# Patient Record
Sex: Female | Born: 1980 | Race: White | Hispanic: No | Marital: Married | Smoking: Former smoker
Health system: Southern US, Community
[De-identification: ages and names within clinical notes are randomized; demographics above are authoritative.]

## PROBLEM LIST (undated history)

## (undated) ENCOUNTER — Inpatient Hospital Stay (HOSPITAL_COMMUNITY): Payer: Self-pay

## (undated) DIAGNOSIS — I499 Cardiac arrhythmia, unspecified: Secondary | ICD-10-CM

## (undated) DIAGNOSIS — M722 Plantar fascial fibromatosis: Secondary | ICD-10-CM

## (undated) DIAGNOSIS — Z8489 Family history of other specified conditions: Secondary | ICD-10-CM

## (undated) DIAGNOSIS — N970 Female infertility associated with anovulation: Secondary | ICD-10-CM

## (undated) DIAGNOSIS — M6701 Short Achilles tendon (acquired), right ankle: Secondary | ICD-10-CM

## (undated) HISTORY — DX: Cardiac arrhythmia, unspecified: I49.9

## (undated) HISTORY — PX: WISDOM TOOTH EXTRACTION: SHX21

---

## 2007-01-04 HISTORY — PX: ERCP: SHX60

## 2007-01-04 HISTORY — PX: OTHER SURGICAL HISTORY: SHX169

## 2011-03-22 ENCOUNTER — Encounter: Payer: Commercial Managed Care - PPO | Admitting: Obstetrics and Gynecology

## 2012-01-04 NOTE — L&D Delivery Note (Signed)
Delivery Note At 8:48 AM a viable and healthy female was delivered via Vaginal, Spontaneous Delivery (Presentation: Right Occiput Anterior).  APGAR: 8, 9; weight 7#6.9.   Placenta status: delivered, intact.  Cord: 3 vessels with the following complications: nuchal cord.    Anesthesia: Epidural  Episiotomy: None Lacerations: 2nd degree Suture Repair: 3.0 vicryl rapide Est. Blood Loss (mL): 400cc  Mom to postpartum.  Baby to stay with mom and dad.Marland Kitchen  [name],Bowyn Mercier 09/09/2012, 9:19 AM  Br/ O neg/ Mirena PP/ RI

## 2012-01-25 LAB — OB RESULTS CONSOLE RPR: RPR: NONREACTIVE

## 2012-01-25 LAB — OB RESULTS CONSOLE HIV ANTIBODY (ROUTINE TESTING): HIV: NONREACTIVE

## 2012-01-25 LAB — OB RESULTS CONSOLE GC/CHLAMYDIA
Chlamydia: NEGATIVE
Gonorrhea: NEGATIVE

## 2012-01-25 LAB — OB RESULTS CONSOLE RUBELLA ANTIBODY, IGM: Rubella: UNDETERMINED

## 2012-07-30 ENCOUNTER — Inpatient Hospital Stay (HOSPITAL_COMMUNITY)
Admission: AD | Admit: 2012-07-30 | Discharge: 2012-07-30 | Disposition: A | Discharge status: Home / Self Care | Payer: BC Managed Care – PPO | Source: Ambulatory Visit | Attending: Obstetrics and Gynecology | Admitting: Obstetrics and Gynecology

## 2012-07-30 ENCOUNTER — Encounter (HOSPITAL_COMMUNITY): Payer: Self-pay

## 2012-07-30 DIAGNOSIS — O479 False labor, unspecified: Secondary | ICD-10-CM

## 2012-07-30 DIAGNOSIS — O4700 False labor before 37 completed weeks of gestation, unspecified trimester: Secondary | ICD-10-CM | POA: Insufficient documentation

## 2012-07-30 DIAGNOSIS — R109 Unspecified abdominal pain: Secondary | ICD-10-CM | POA: Insufficient documentation

## 2012-07-30 DIAGNOSIS — R42 Dizziness and giddiness: Secondary | ICD-10-CM | POA: Insufficient documentation

## 2012-07-30 LAB — URINALYSIS, ROUTINE W REFLEX MICROSCOPIC
Bilirubin Urine: NEGATIVE
Glucose, UA: NEGATIVE mg/dL
Hgb urine dipstick: NEGATIVE
Specific Gravity, Urine: 1.015 (ref 1.005–1.030)
Urobilinogen, UA: 0.2 mg/dL (ref 0.0–1.0)

## 2012-07-30 LAB — URINE MICROSCOPIC-ADD ON

## 2012-07-30 NOTE — MAU Note (Signed)
Pt c/o woke up from nap shaking and cold, then shortly after started feeling flushed and actively sweating on palms, wrists, forehead and arm pits. Denies any pain.

## 2012-07-30 NOTE — MAU Note (Signed)
Patient is in with feeling of "something isn't right", possible intermittent contraction due to persistent cramping, bp lower than her normal. She denies vaginal bleeding or lof. She reports good fetal movement. fht 145

## 2012-07-30 NOTE — MAU Provider Note (Signed)
  History     CSN: [id number]  Arrival date and time: 07/30/12 2013   First Provider Initiated Contact with Patient 07/30/12 2111      Chief Complaint  Patient presents with  . Abdominal Cramping  . Dizziness   HPI  Laurie [name] is a 32 y.o. G1P0 at Unknown who presents today with vague complaints of feeling "yucky".  Past Medical History  Diagnosis Date  . Pancreatitis     Past Surgical History  Procedure Laterality Date  . Sphincterectomy    . Wisdom tooth extraction      Family History  Problem Relation Age of Onset  . Hypertension Mother   . COPD Maternal Grandmother     History  Substance Use Topics  . Smoking status: Never Smoker   . Smokeless tobacco: Not on file  . Alcohol Use: No    Allergies: No Known Allergies  Prescriptions prior to admission  Medication Sig Dispense Refill  . Prenatal Vit-Fe Fumarate-FA (PRENATAL MULTIVITAMIN) TABS Take 1 tablet by mouth at bedtime.        ROS Physical Exam   Blood pressure 133/91, pulse 99, resp. rate 16, height 5\' 6"  (1.676 m), weight 91.627 kg (202 lb), SpO2 99.00%.  Physical Exam  MAU Course  Procedures  Results for orders placed during the hospital encounter of 07/30/12 (from the past 24 hour(s))  URINALYSIS, ROUTINE W REFLEX MICROSCOPIC     Status: Abnormal   Collection Time    07/30/12  8:35 PM      Result Value Range   Color, Urine YELLOW  YELLOW   APPearance CLEAR  CLEAR   Specific Gravity, Urine 1.015  1.005 - 1.030   pH 6.0  5.0 - 8.0   Glucose, UA NEGATIVE  NEGATIVE mg/dL   Hgb urine dipstick NEGATIVE  NEGATIVE   Bilirubin Urine NEGATIVE  NEGATIVE   Ketones, ur >80 (*) NEGATIVE mg/dL   Protein, ur NEGATIVE  NEGATIVE mg/dL   Urobilinogen, UA 0.2  0.0 - 1.0 mg/dL   Nitrite NEGATIVE  NEGATIVE   Leukocytes, UA TRACE (*) NEGATIVE  URINE MICROSCOPIC-ADD ON     Status: Abnormal   Collection Time    07/30/12  8:35 PM      Result Value Range   Squamous Epithelial / LPF FEW (*) RARE    WBC, UA 3-6  <3 WBC/hpf   Bacteria, UA FEW (*) RARE   Urine-Other MUCOUS PRESENT    GLUCOSE, CAPILLARY     Status: None   Collection Time    07/30/12  9:26 PM      Result Value Range   Glucose-Capillary 98  70 - 99 mg/dL    4098: Spoke with Dr. Ambrose Mantle, ok for dc home Assessment and Plan   1. Braxton Hick's contraction    Likely 2/2 dehydration. Increase PO fluids PTL precautions given FU with the office as planned   Tawnya Crook 07/30/2012, 9:20 PM

## 2012-07-31 LAB — URINE CULTURE: Colony Count: 8000

## 2012-08-29 ENCOUNTER — Telehealth (HOSPITAL_COMMUNITY): Payer: Self-pay | Admitting: *Deleted

## 2012-08-29 ENCOUNTER — Encounter (HOSPITAL_COMMUNITY): Payer: Self-pay | Admitting: *Deleted

## 2012-08-29 NOTE — Telephone Encounter (Signed)
Preadmission screen  

## 2012-09-04 ENCOUNTER — Inpatient Hospital Stay (HOSPITAL_COMMUNITY): Admission: RE | Admit: 2012-09-04 | Payer: BC Managed Care – PPO | Source: Ambulatory Visit

## 2012-09-08 ENCOUNTER — Encounter (HOSPITAL_COMMUNITY): Payer: Self-pay | Admitting: *Deleted

## 2012-09-08 ENCOUNTER — Inpatient Hospital Stay (HOSPITAL_COMMUNITY): Payer: BC Managed Care – PPO | Admitting: Anesthesiology

## 2012-09-08 ENCOUNTER — Encounter (HOSPITAL_COMMUNITY): Payer: Self-pay | Admitting: Anesthesiology

## 2012-09-08 ENCOUNTER — Inpatient Hospital Stay (HOSPITAL_COMMUNITY)
Admission: AD | Admit: 2012-09-08 | Discharge: 2012-09-11 | DRG: 372 | Disposition: A | Discharge status: Home / Self Care | Payer: BC Managed Care – PPO | Source: Ambulatory Visit | Attending: Obstetrics and Gynecology | Admitting: Obstetrics and Gynecology

## 2012-09-08 DIAGNOSIS — Z3400 Encounter for supervision of normal first pregnancy, unspecified trimester: Secondary | ICD-10-CM

## 2012-09-08 DIAGNOSIS — O99892 Other specified diseases and conditions complicating childbirth: Secondary | ICD-10-CM | POA: Diagnosis present

## 2012-09-08 DIAGNOSIS — Z22330 Carrier of Group B streptococcus: Secondary | ICD-10-CM

## 2012-09-08 DIAGNOSIS — O4290 Premature rupture of membranes, unspecified as to length of time between rupture and onset of labor, unspecified weeks of gestation: Secondary | ICD-10-CM

## 2012-09-08 LAB — COMPREHENSIVE METABOLIC PANEL
ALT: 10 U/L (ref 0–35)
AST: 15 U/L (ref 0–37)
Calcium: 9.9 mg/dL (ref 8.4–10.5)
GFR calc Af Amer: >90 mL/min (ref >90)
Sodium: 137 mEq/L (ref 135–145)
Total Protein: 6.1 g/dL (ref 6.0–8.3)

## 2012-09-08 LAB — CBC
HCT: 40.4 % (ref 36.0–46.0)
Hemoglobin: 14.2 g/dL (ref 12.0–15.0)
MCH: 32.9 pg (ref 26.0–34.0)
MCHC: 35.1 g/dL (ref 30.0–36.0)

## 2012-09-08 LAB — POCT FERN TEST: POCT Fern Test: POSITIVE

## 2012-09-08 MED ORDER — FENTANYL 2.5 MCG/ML BUPIVACAINE 1/10 % EPIDURAL INFUSION (WH - ANES)
INTRAMUSCULAR | Status: DC | PRN
  Administered 2012-09-08: 14 mL/h via EPIDURAL

## 2012-09-08 MED ORDER — FENTANYL 2.5 MCG/ML BUPIVACAINE 1/10 % EPIDURAL INFUSION (WH - ANES)
14.0000 mL/h | INTRAMUSCULAR | Status: DC | PRN
  Administered 2012-09-09: 14 mL/h via EPIDURAL
  Filled 2012-09-08 (×2): qty 125

## 2012-09-08 MED ORDER — OXYTOCIN BOLUS FROM INFUSION: 500.0000 mL | INTRAVENOUS | Status: DC

## 2012-09-08 MED ORDER — OXYTOCIN 40 UNITS IN LACTATED RINGERS INFUSION - SIMPLE MED
62.5000 mL/h | INTRAVENOUS | Status: DC
  Filled 2012-09-08: qty 1000

## 2012-09-08 MED ORDER — IBUPROFEN 600 MG PO TABS: 600.0000 mg | ORAL_TABLET | Freq: Four times a day (QID) | ORAL | Status: DC | PRN

## 2012-09-08 MED ORDER — PENICILLIN G POTASSIUM 5000000 UNITS IJ SOLR
5.0000 10*6.[IU] | Freq: Once | INTRAVENOUS | Status: AC
  Administered 2012-09-08: 5 10*6.[IU] via INTRAVENOUS
  Filled 2012-09-08: qty 5

## 2012-09-08 MED ORDER — PENICILLIN G POTASSIUM 5000000 UNITS IJ SOLR
2.5000 10*6.[IU] | INTRAVENOUS | Status: DC
  Filled 2012-09-08 (×4): qty 2.5

## 2012-09-08 MED ORDER — CITRIC ACID-SODIUM CITRATE 334-500 MG/5ML PO SOLN: 30.0000 mL | ORAL | Status: DC | PRN

## 2012-09-08 MED ORDER — OXYTOCIN 40 UNITS IN LACTATED RINGERS INFUSION - SIMPLE MED
1.0000 m[IU]/min | INTRAVENOUS | Status: DC
  Administered 2012-09-09: 2 m[IU]/min via INTRAVENOUS

## 2012-09-08 MED ORDER — FLEET ENEMA 7-19 GM/118ML RE ENEM: 1.0000 | ENEMA | RECTAL | Status: DC | PRN

## 2012-09-08 MED ORDER — LIDOCAINE HCL (PF) 1 % IJ SOLN
30.0000 mL | INTRAMUSCULAR | Status: DC | PRN
  Filled 2012-09-08: qty 30

## 2012-09-08 MED ORDER — LACTATED RINGERS IV SOLN
INTRAVENOUS | Status: DC
  Administered 2012-09-08 – 2012-09-09 (×2): via INTRAVENOUS

## 2012-09-08 MED ORDER — EPHEDRINE 5 MG/ML INJ
10.0000 mg | INTRAVENOUS | Status: DC | PRN
  Filled 2012-09-08: qty 2
  Filled 2012-09-08: qty 4

## 2012-09-08 MED ORDER — BUTORPHANOL TARTRATE 1 MG/ML IJ SOLN: 2.0000 mg | INTRAMUSCULAR | Status: DC | PRN

## 2012-09-08 MED ORDER — PHENYLEPHRINE 40 MCG/ML (10ML) SYRINGE FOR IV PUSH (FOR BLOOD PRESSURE SUPPORT)
80.0000 ug | PREFILLED_SYRINGE | INTRAVENOUS | Status: DC | PRN
  Filled 2012-09-08: qty 2

## 2012-09-08 MED ORDER — DIPHENHYDRAMINE HCL 50 MG/ML IJ SOLN
12.5000 mg | INTRAMUSCULAR | Status: DC | PRN
  Administered 2012-09-09 (×2): 12.5 mg via INTRAVENOUS
  Filled 2012-09-08: qty 1

## 2012-09-08 MED ORDER — ACETAMINOPHEN 325 MG PO TABS: 650.0000 mg | ORAL_TABLET | ORAL | Status: DC | PRN

## 2012-09-08 MED ORDER — TERBUTALINE SULFATE 1 MG/ML IJ SOLN: 0.2500 mg | Freq: Once | INTRAMUSCULAR | Status: AC | PRN

## 2012-09-08 MED ORDER — OXYCODONE-ACETAMINOPHEN 5-325 MG PO TABS: 1.0000 | ORAL_TABLET | ORAL | Status: DC | PRN

## 2012-09-08 MED ORDER — PHENYLEPHRINE 40 MCG/ML (10ML) SYRINGE FOR IV PUSH (FOR BLOOD PRESSURE SUPPORT)
80.0000 ug | PREFILLED_SYRINGE | INTRAVENOUS | Status: DC | PRN
  Filled 2012-09-08: qty 2
  Filled 2012-09-08: qty 5

## 2012-09-08 MED ORDER — LACTATED RINGERS IV SOLN: 500.0000 mL | Freq: Once | INTRAVENOUS | Status: DC

## 2012-09-08 MED ORDER — LACTATED RINGERS IV SOLN: 500.0000 mL | INTRAVENOUS | Status: DC | PRN

## 2012-09-08 MED ORDER — LIDOCAINE HCL (PF) 1 % IJ SOLN
INTRAMUSCULAR | Status: DC | PRN
  Administered 2012-09-08 (×2): 9 mL

## 2012-09-08 MED ORDER — ONDANSETRON HCL 4 MG/2ML IJ SOLN
4.0000 mg | Freq: Four times a day (QID) | INTRAMUSCULAR | Status: DC | PRN
  Administered 2012-09-09: 4 mg via INTRAVENOUS
  Filled 2012-09-08: qty 2

## 2012-09-08 MED ORDER — EPHEDRINE 5 MG/ML INJ
10.0000 mg | INTRAVENOUS | Status: DC | PRN
  Filled 2012-09-08: qty 2

## 2012-09-08 NOTE — Anesthesia Preprocedure Evaluation (Signed)
Anesthesia Evaluation  Patient identified by MRN, date of birth, ID band Patient awake    Reviewed: Allergy & Precautions, H&P , NPO status , Patient's Chart, lab work & pertinent test results  Airway Mallampati: II TM Distance: >3 FB Neck ROM: full    Dental no notable dental hx.    Pulmonary neg pulmonary ROS,    Pulmonary exam normal       Cardiovascular negative cardio ROS      Neuro/Psych negative neurological ROS     GI/Hepatic negative GI ROS, Neg liver ROS,   Endo/Other  negative endocrine ROS  Renal/GU negative Renal ROS  negative genitourinary   Musculoskeletal negative musculoskeletal ROS (+)   Abdominal Normal abdominal exam  (+)   Peds  Hematology negative hematology ROS (+)   Anesthesia Other Findings   Reproductive/Obstetrics (+) Pregnancy                           Anesthesia Physical Anesthesia Plan  ASA: II  Anesthesia Plan: Epidural   Post-op Pain Management:    Induction:   Airway Management Planned:   Additional Equipment:   Intra-op Plan:   Post-operative Plan:   Informed Consent: I have reviewed the patients History and Physical, chart, labs and discussed the procedure including the risks, benefits and alternatives for the proposed anesthesia with the patient or authorized representative who has indicated his/her understanding and acceptance.     Plan Discussed with:   Anesthesia Plan Comments:         Anesthesia Quick Evaluation  

## 2012-09-08 NOTE — Anesthesia Procedure Notes (Signed)
Epidural Patient location during procedure: OB Start time: 09/08/2012 11:37 PM End time: 09/08/2012 11:42 PM  Staffing Anesthesiologist: Sandrea Hughs Performed by: anesthesiologist   Preanesthetic Checklist Completed: patient identified, surgical consent, pre-op evaluation, timeout performed, IV checked, risks and benefits discussed and monitors and equipment checked  Epidural Patient position: sitting Prep: site prepped and draped and DuraPrep Patient monitoring: continuous pulse ox and blood pressure Approach: midline Injection technique: LOR air  Needle:  Needle type: Tuohy  Needle gauge: 17 G Needle length: 9 cm and 9 Needle insertion depth: 6 cm Catheter type: closed end flexible Catheter size: 19 Gauge Catheter at skin depth: 11 cm Test dose: negative and Other  Assessment Sensory level: T9 Events: blood not aspirated, injection not painful, no injection resistance, negative IV test and no paresthesia  Additional Notes Reason for block:procedure for pain

## 2012-09-08 NOTE — MAU Note (Addendum)
Pt reports gush of fluid @ 1845

## 2012-09-08 NOTE — MAU Provider Note (Signed)
  History     CSN: [id number]  Arrival date and time: 09/08/12 1931   First Provider Initiated Contact with Patient 09/08/12 2050      Chief Complaint  Patient presents with  . Rupture of Membranes   HPI   Report SROM of "yellowish" fluid at 1845. Some UCs, not painful. Baby is active.  Past Medical History  Diagnosis Date  . Pancreatitis   . Dysrhythmia     PVCs  . Acne     accutane in college  . ADD (attention deficit disorder)   . Hx of pancreatitis   . Congenital defect     bile duct    Past Surgical History  Procedure Laterality Date  . Sphincterectomy    . Wisdom tooth extraction      Family History  Problem Relation Age of Onset  . Hypertension Mother   . COPD Maternal Grandmother     History  Substance Use Topics  . Smoking status: Never Smoker   . Smokeless tobacco: Not on file  . Alcohol Use: No    Allergies: No Known Allergies  Prescriptions prior to admission  Medication Sig Dispense Refill  . calcium carbonate (TUMS - DOSED IN MG ELEMENTAL CALCIUM) 500 MG chewable tablet Chew 1 tablet by mouth daily as needed for heartburn.      . Prenatal Vit-Fe Fumarate-FA (PRENATAL MULTIVITAMIN) TABS Take 1 tablet by mouth at bedtime.      . ranitidine (ZANTAC) 150 MG tablet Take 150 mg by mouth at bedtime.        ROS Physical Exam   Blood pressure 141/87, pulse 88, temperature 98.7 F (37.1 C), resp. rate 18, last menstrual period 11/15/2011.  Physical Exam  FHT: 140, moderate with 15x 15 accels, no decels Toco: irregular UCs External: no lesion Vagina: small amount of mucous seen Cervix: pink, smooth, small trickle from os with valsalva. 2/60/-1 Uterus: AGA  Trickle of light MSF after SVE.  MAU Course  Procedures    Assessment and Plan  PROM RN called report to Dr. Ellyn Hack.   Tawnya Crook 09/08/2012, 9:07 PM

## 2012-09-09 ENCOUNTER — Encounter (HOSPITAL_COMMUNITY): Payer: Self-pay | Admitting: Obstetrics and Gynecology

## 2012-09-09 DIAGNOSIS — Z3400 Encounter for supervision of normal first pregnancy, unspecified trimester: Secondary | ICD-10-CM

## 2012-09-09 DIAGNOSIS — O4290 Premature rupture of membranes, unspecified as to length of time between rupture and onset of labor, unspecified weeks of gestation: Secondary | ICD-10-CM

## 2012-09-09 LAB — URINALYSIS, ROUTINE W REFLEX MICROSCOPIC
Bilirubin Urine: NEGATIVE
Glucose, UA: NEGATIVE mg/dL
Ketones, ur: NEGATIVE mg/dL
Nitrite: NEGATIVE
pH: 6 (ref 5.0–8.0)

## 2012-09-09 LAB — ABO/RH: ABO/RH(D): O NEG

## 2012-09-09 MED ORDER — WITCH HAZEL-GLYCERIN EX PADS: 1.0000 "application " | MEDICATED_PAD | CUTANEOUS | Status: DC | PRN

## 2012-09-09 MED ORDER — SODIUM CHLORIDE 0.9 % IV SOLN
2.0000 g | Freq: Four times a day (QID) | INTRAVENOUS | Status: DC
  Administered 2012-09-09: 2 g via INTRAVENOUS
  Filled 2012-09-09 (×3): qty 2000

## 2012-09-09 MED ORDER — SIMETHICONE 80 MG PO CHEW: 80.0000 mg | CHEWABLE_TABLET | ORAL | Status: DC | PRN

## 2012-09-09 MED ORDER — CALCIUM CARBONATE ANTACID 500 MG PO CHEW: 1.0000 | CHEWABLE_TABLET | Freq: Every day | ORAL | Status: DC | PRN

## 2012-09-09 MED ORDER — FAMOTIDINE 20 MG PO TABS
20.0000 mg | ORAL_TABLET | Freq: Every day | ORAL | Status: DC
  Filled 2012-09-09: qty 1

## 2012-09-09 MED ORDER — ZOLPIDEM TARTRATE 5 MG PO TABS: 5.0000 mg | ORAL_TABLET | Freq: Every evening | ORAL | Status: DC | PRN

## 2012-09-09 MED ORDER — LANOLIN HYDROUS EX OINT: TOPICAL_OINTMENT | CUTANEOUS | Status: DC | PRN

## 2012-09-09 MED ORDER — PRENATAL MULTIVITAMIN CH
1.0000 | ORAL_TABLET | Freq: Every day | ORAL | Status: DC
  Administered 2012-09-09 – 2012-09-10 (×2): 1 via ORAL

## 2012-09-09 MED ORDER — BENZOCAINE-MENTHOL 20-0.5 % EX AERO
1.0000 "application " | INHALATION_SPRAY | CUTANEOUS | Status: DC | PRN
  Filled 2012-09-09 (×2): qty 56

## 2012-09-09 MED ORDER — OXYCODONE-ACETAMINOPHEN 5-325 MG PO TABS
1.0000 | ORAL_TABLET | ORAL | Status: DC | PRN
  Administered 2012-09-10: 0.5 via ORAL
  Filled 2012-09-09: qty 1

## 2012-09-09 MED ORDER — GENTAMICIN SULFATE 40 MG/ML IJ SOLN
190.0000 mg | Freq: Once | INTRAVENOUS | Status: AC
  Administered 2012-09-09: 190 mg via INTRAVENOUS
  Filled 2012-09-09: qty 4.75

## 2012-09-09 MED ORDER — SENNOSIDES-DOCUSATE SODIUM 8.6-50 MG PO TABS
2.0000 | ORAL_TABLET | Freq: Every day | ORAL | Status: DC
  Administered 2012-09-09: 2 via ORAL

## 2012-09-09 MED ORDER — DIPHENHYDRAMINE HCL 25 MG PO CAPS: 25.0000 mg | ORAL_CAPSULE | Freq: Four times a day (QID) | ORAL | Status: DC | PRN

## 2012-09-09 MED ORDER — ONDANSETRON HCL 4 MG PO TABS: 4.0000 mg | ORAL_TABLET | ORAL | Status: DC | PRN

## 2012-09-09 MED ORDER — GENTAMICIN SULFATE 40 MG/ML IJ SOLN
170.0000 mg | Freq: Three times a day (TID) | INTRAVENOUS | Status: DC
  Filled 2012-09-09: qty 4.25

## 2012-09-09 MED ORDER — LACTATED RINGERS IV SOLN: INTRAVENOUS | Status: DC

## 2012-09-09 MED ORDER — IBUPROFEN 600 MG PO TABS
600.0000 mg | ORAL_TABLET | Freq: Four times a day (QID) | ORAL | Status: DC
  Administered 2012-09-09 – 2012-09-11 (×7): 600 mg via ORAL
  Filled 2012-09-09 (×6): qty 1

## 2012-09-09 MED ORDER — ACETAMINOPHEN 500 MG PO TABS
1000.0000 mg | ORAL_TABLET | Freq: Four times a day (QID) | ORAL | Status: DC | PRN
  Administered 2012-09-09: 1000 mg via ORAL
  Filled 2012-09-09: qty 2

## 2012-09-09 MED ORDER — ONDANSETRON HCL 4 MG/2ML IJ SOLN: 4.0000 mg | INTRAMUSCULAR | Status: DC | PRN

## 2012-09-09 MED ORDER — DIBUCAINE 1 % RE OINT
1.0000 "application " | TOPICAL_OINTMENT | RECTAL | Status: DC | PRN
  Filled 2012-09-09: qty 28

## 2012-09-09 NOTE — Progress Notes (Signed)
This note also relates to the following rows which could not be included: SpO2 - Cannot attach notes to unvalidated device data  Provider notified of pt cervix, fhr tachycardia.  Dr. Ellyn Hack states she will come to hospital.

## 2012-09-09 NOTE — Progress Notes (Signed)
Patient ID: Laurie [name], female   DOB: 1980/08/31, 32 y.o.   MRN: [id number]  Pt feeling pressure  AFVSS gen NAD FHTs 160-170's category 1 toco Q 4 min  SVE rim per RN  Will likely start pushing soon Pt had a temperature O/N given Amp/Gent and Tylenol

## 2012-09-09 NOTE — H&P (Addendum)
MARDEL GRUDZIEN is a 32 y.o. female  G1P0 with ROM, ? Meconium.  +FM, LOF at 4:30pm, no VB, occ ctx.  Pregnancy complicated by occ PVC's, h/o bile duct anomaly - pancreatitis.  Pregnancy complicated by elevated glucola, one value elevated on 3 hr GTT, repeat, one value elevated.  Also + GBBS . Maternal Medical History:  Reason for admission: Rupture of membranes.   Contractions: Frequency: regular.   Perceived severity is moderate.    Fetal activity: Perceived fetal activity is normal.    Prenatal complications: no prenatal complications Prenatal Complications - Diabetes: none.    OB History   Grav Para Term Preterm Abortions TAB SAB Ect Mult Living   1         0    G1 present + abn pap, colpo, nl f/u No STDs Past Medical History  Diagnosis Date  . Pancreatitis   . Dysrhythmia     PVCs  . Acne     accutane in college  . ADD (attention deficit disorder)   . Hx of pancreatitis   . Congenital defect     bile duct  . Normal pregnancy, first 09/09/2012   Past Surgical History  Procedure Laterality Date  . Sphincterectomy    . Wisdom tooth extraction     Family History: family history includes COPD in her maternal grandmother; Hypertension in her mother.Crohn's, DM, fibroids, CAD, HTN, IBS, syringomelia Social History:  reports that she has never smoked. She does not have any smokeless tobacco history on file. She reports that she does not drink alcohol or use illicit drugs. BSN, married Meds PNV All NKDA  Prenatal Transfer Tool  Maternal Diabetes: No Genetic Screening: Normal Maternal Ultrasounds/Referrals: Normal Fetal Ultrasounds or other Referrals:  None Maternal Substance Abuse:  No Significant Maternal Medications:  None Significant Maternal Lab Results:  Lab values include: Group B Strep positive Other Comments:  elevated glucola, 3 hr with 1 value elevated x 2; h/o congenital bile duct defect  Review of Systems  Constitutional: Negative.   HENT: Negative.    Eyes: Negative.   Respiratory: Negative.   Cardiovascular: Negative.   Gastrointestinal: Negative.   Genitourinary: Negative.   Musculoskeletal: Negative.   Skin: Negative.   Neurological: Negative.   Psychiatric/Behavioral: Negative.     Dilation: 4 Effacement (%): 80 Station: -2 Exam by:: B.Caqna,RN Blood pressure 142/91, pulse 100, temperature 98.1 F (36.7 C), temperature source Oral, resp. rate 20, height 5\' 6"  (1.676 m), weight 96.163 kg (212 lb), last menstrual period 11/15/2011, SpO2 98.00%. Maternal Exam:  Uterine Assessment: Contraction strength is moderate.  Contraction frequency is regular.   Abdomen: Fundal height is appropriate for gestation.   Estimated fetal weight is 7.5 - 8 #.   Fetal presentation: vertex  Introitus: Normal vulva. Normal vagina.  Ferning test: positive.   Pelvis: adequate for delivery.   Cervix: Cervix evaluated by digital exam.     Physical Exam  Constitutional: She is oriented to person, place, and time. She appears well-developed and well-nourished.  HENT:  Head: Normocephalic and atraumatic.  Cardiovascular: Normal rate and regular rhythm.   Respiratory: Effort normal and breath sounds normal. No respiratory distress.  GI: Soft. Bowel sounds are normal. There is no tenderness.  Musculoskeletal: Normal range of motion.  Neurological: She is alert and oriented to person, place, and time.  Skin: Skin is warm and dry.  Psychiatric: She has a normal mood and affect. Her behavior is normal.    Prenatal labs: ABO, Rh:  O/Negative/-- (01/22 0000) Antibody: Negative (01/22 0000) Rubella: Equivocal (01/22 0000) RPR: Nonreactive (01/22 0000)  HBsAg: Negative (01/22 0000)  HIV: Non-reactive (01/22 0000)  GBS: Positive (07/29 0000)   Flu 11/13, Rhogam 6/14,  Dated by 7 wk [place] [facility] 09/01/12 CF neg/hgb 14.1/ Plt 325K/Chl neg/ GC neg/ First Tri Scr WNL  [place] nl anat, ant plac  Assessment/Plan: 32yo G1P0 at 41 with ROM Light meconium, will  monitor Pitocin to augment Expect SVD GBBS + - PCN for prophylaxis   [name],Samin Milke 09/09/2012, 1:40 AM

## 2012-09-09 NOTE — Lactation Note (Signed)
This note was copied from the chart of Laurie Irven [place]. Lactation Consultation Note   Initial consult with this mom and baby,now 5 hours post partum, the baby has fed continuously since she was born. Mom was feeding in cradle hold with  The baby pulling on her nipple. i showed mom how to cross cradle, and , with mom's permission, unlatched and helped mom to relatch her . Mom could fel and see the proper latch. I encouragd skin to skin, reviewed cue based and cluster feeding, and did basic breast feeding teaching from the baby and me book. I also showed mom how to hand express, and reviewed lactation and community services. Mom is a Producer, television/film/video.  Patient Name: Laurie [name] [id number] Date: 09/09/2012 Reason for consult: Initial assessment   Maternal Data Formula Feeding for Exclusion: No Has patient been taught Hand Expression?: Yes Does the patient have breastfeeding experience prior to this delivery?: No  Feeding Feeding Type: Breast Milk Length of feed: 22 min (continuos feeding)  LATCH Score/Interventions Latch: Grasps breast easily, tongue down, lips flanged, rhythmical sucking.  Audible Swallowing: None  Type of Nipple: Everted at rest and after stimulation  Comfort (Breast/Nipple): Soft / non-tender     Hold (Positioning): Assistance needed to correctly position infant at breast and maintain latch. (cradle to cross cradel for deeper latch) Intervention(s): Breastfeeding basics reviewed;Support Pillows;Position options;Skin to skin  LATCH Score: 7  Lactation Tools Discussed/Used     Consult Status Consult Status: Follow-up Date: 09/10/12 Follow-up type: In-patient    Alfred Levins 09/09/2012, 2:25 PM

## 2012-09-09 NOTE — Progress Notes (Signed)
Called MD to report cervical change, uc pattern and pitocin off, fever and fhr.  Orders received.

## 2012-09-09 NOTE — Progress Notes (Signed)
ANTIBIOTIC CONSULT NOTE - INITIAL  Pharmacy Consult for gentamicin Indication: maternal fever - R/O chorioamniotis  No Known Allergies  Patient Measurements: Height: 5\' 6"  (167.6 cm) Weight: 212 lb (96.163 kg) IBW/kg (Calculated) : 59.3 Adjusted Body Weight: 67.8 kg  Vital Signs: Temp: 100.9 F (38.3 C) (09/07 0400) Temp src: Axillary (09/07 0400) BP: 147/98 mmHg (09/07 0430) Pulse Rate: 122 (09/07 0430) Intake/Output from previous day:   Intake/Output from this shift:    Labs:  Recent Labs  09/08/12 2150  WBC 18.9*  HGB 14.2  PLT 223  CREATININE 0.51   Estimated Creatinine Clearance: 119.2 ml/min (by C-G formula based on Cr of 0.51).   Microbiology: No results found for this or any previous visit (from the past 720 hour(s)).  Medical History: Past Medical History  Diagnosis Date  . Pancreatitis   . Dysrhythmia     PVCs  . Acne     accutane in college  . ADD (attention deficit disorder)   . Hx of pancreatitis   . Congenital defect     bile duct  . Normal pregnancy, first 09/09/2012    Medications:  Scheduled:  . ampicillin (OMNIPEN) IV  2 g Intravenous Q6H  . lactated ringers  500 mL Intravenous Once   Infusions:  . fentaNYL 2.5 mcg/ml w/bupivacaine 1/10% in NS epidural infusion ( total) 14 mL/hr (09/09/12 0412)  . lactated ringers 125 mL/hr at 09/09/12 0210  . oxytocin 40 units in LR 1000 mL    . oxytocin 40 units in LR 1000 mL    . oxytocin 40 units in LR 1000 mL Stopped (09/09/12 0400)   PRN: acetaminophen, acetaminophen, butorphanol, citric acid-sodium citrate, diphenhydrAMINE, ePHEDrine, ePHEDrine, fentaNYL 2.5 mcg/ml w/bupivacaine 1/10% in NS epidural infusion ( total), ibuprofen, lactated ringers, lidocaine (PF), ondansetron, oxyCODONE-acetaminophen, phenylephrine, phenylephrine, sodium phosphate  Assessment: 78yr female G1P0 term gestation ROM ? Meconium      On PCN x 2 doses, then started Ampicillin 2gm IV q6h  Goal  of Therapy:  Desire gentamicin peak serum level 6-8mcg/ml and trough <74mcg/ml  Plan:  1. Loading dose 190mg  IV gentamicin x 1, then 2. Maintenance regimen 170mg  gentamicin IV q8h (if tx continued) 3.  Will measure actual serum gentamicin levels if tx >48hr or as clinically indicated  Scarlett Presto 09/09/2012,4:41 AM

## 2012-09-09 NOTE — Progress Notes (Signed)
Dr. Arby Barrette in room for epidural, time out done, consent signed.

## 2012-09-10 LAB — CBC
MCHC: 34.5 g/dL (ref 30.0–36.0)
Platelets: 188 10*3/uL (ref 150–400)
RDW: 14.2 % (ref 11.5–15.5)

## 2012-09-10 MED ORDER — MEASLES, MUMPS & RUBELLA VAC ~~LOC~~ INJ
0.5000 mL | INJECTION | Freq: Once | SUBCUTANEOUS | Status: AC
  Administered 2012-09-11: 0.5 mL via SUBCUTANEOUS
  Filled 2012-09-10 (×2): qty 0.5

## 2012-09-10 MED ORDER — RHO D IMMUNE GLOBULIN 1500 UNIT/2ML IJ SOLN
300.0000 ug | Freq: Once | INTRAMUSCULAR | Status: AC
  Administered 2012-09-10: 300 ug via INTRAMUSCULAR
  Filled 2012-09-10: qty 2

## 2012-09-10 MED ORDER — TETANUS-DIPHTH-ACELL PERTUSSIS 5-2.5-18.5 LF-MCG/0.5 IM SUSP
0.5000 mL | Freq: Once | INTRAMUSCULAR | Status: AC
  Administered 2012-09-10: 0.5 mL via INTRAMUSCULAR

## 2012-09-10 NOTE — Progress Notes (Signed)
Post Partum Day 1 Subjective: no complaints, up ad lib, voiding, tolerating PO and nl lochia, pain controlled  Objective: Blood pressure 131/90, pulse 99, temperature 97.8 F (36.6 C), temperature source Axillary, resp. rate 18, height 5\' 6"  (1.676 m), weight 96.163 kg (212 lb), last menstrual period 11/15/2011, SpO2 97.00%, unknown if currently breastfeeding.  Physical Exam:  General: alert and no distress Lochia: appropriate Uterine Fundus: firm   Recent Labs  09/08/12 2150 09/10/12 0555  HGB 14.2 12.3  HCT 40.4 35.7*    Assessment/Plan: Plan for discharge tomorrow, Breastfeeding and Lactation consult.  Routine PP care.     LOS: 2 days   [name],Laurie Avans 09/10/2012, 8:01 AM

## 2012-09-10 NOTE — Anesthesia Postprocedure Evaluation (Signed)
  Anesthesia Post-op Note  Patient: Laurie [name]  Procedure(s) Performed: * No procedures listed *  Patient Location: PACU and Mother/Baby  Anesthesia Type:Epidural  Level of Consciousness: awake, alert  and oriented  Airway and Oxygen Therapy: Patient Spontanous Breathing  Post-op Pain: none  Post-op Assessment: Post-op Vital signs reviewed, Patient's Cardiovascular Status Stable, No headache, No backache, No residual numbness and No residual motor weakness  Post-op Vital Signs: Reviewed and stable  Complications: No apparent anesthesia complications

## 2012-09-11 ENCOUNTER — Encounter (HOSPITAL_COMMUNITY): Payer: Self-pay | Admitting: Obstetrics and Gynecology

## 2012-09-11 LAB — RH IG WORKUP (INCLUDES ABO/RH)
ABO/RH(D): O NEG
Gestational Age(Wks): 41

## 2012-09-11 MED ORDER — OXYCODONE-ACETAMINOPHEN 5-325 MG PO TABS: 1.0000 | ORAL_TABLET | Freq: Four times a day (QID) | ORAL | Status: DC | PRN

## 2012-09-11 MED ORDER — IBUPROFEN 800 MG PO TABS: 800.0000 mg | ORAL_TABLET | Freq: Three times a day (TID) | ORAL | Status: DC | PRN

## 2012-09-11 MED ORDER — PRENATAL MULTIVITAMIN CH: 1.0000 | ORAL_TABLET | Freq: Every day | ORAL | Status: DC

## 2012-09-11 NOTE — Lactation Note (Signed)
This note was copied from the chart of Laurie Irven [place]. Lactation Consultation Note  Mom states br feeding is going very well; mom holding baby in cross cradle; mom comfortable, baby feeding very well. Mom had some questions, discussed mom's questions re milk supply. Enc mom to call lactation office if she has any concerns, and to attend the BFS[facility].  Patient Name: Laurie [name] Date: 09/11/2012 Reason for consult: Follow-up assessment   Maternal Data    Feeding Feeding Type: Breast Milk  LATCH Score/Interventions Latch: Grasps breast easily, tongue down, lips flanged, rhythmical sucking.  Audible Swallowing: A few with stimulation  Type of Nipple: Everted at rest and after stimulation  Comfort (Breast/Nipple): Soft / non-tender     Hold (Positioning): No assistance needed to correctly position infant at breast. Intervention(s): Breastfeeding basics reviewed;Support Pillows;Position options;Skin to skin  LATCH Score: 9  Lactation Tools Discussed/Used     Consult Status Consult Status: Complete    Laurie, [name] 09/11/2012, 11:22 AM

## 2012-09-11 NOTE — Progress Notes (Signed)
Post Partum Day 2 Subjective: no complaints, up ad lib, voiding, + flatus and nl lochia, pain controlled  Objective: Blood pressure 118/68, pulse 85, temperature 97.7 F (36.5 C), temperature source Oral, resp. rate 18, height 5\' 6"  (1.676 m), weight 96.163 kg (212 lb), last menstrual period 11/15/2011, SpO2 97.00%, unknown if currently breastfeeding.  Physical Exam:  General: alert and no distress Lochia: appropriate Uterine Fundus: firm   Recent Labs  09/08/12 2150 09/10/12 0555  HGB 14.2 12.3  HCT 40.4 35.7*    Assessment/Plan: Discharge home, Breastfeeding and Lactation consult.  Routine care.  D/c home with motrin, percocet, pnv.  F/u 6 weeks.     LOS: 3 days   [name],Khalid Lacko 09/11/2012, 7:48 AM

## 2012-09-11 NOTE — Discharge Summary (Signed)
Obstetric Discharge Summary Reason for Admission: rupture of membranes Prenatal Procedures: none Intrapartum Procedures: spontaneous vaginal delivery Postpartum Procedures: none Complications-Operative and Postpartum: 2nd degree perineal laceration Hemoglobin  Date Value Range Status  09/10/2012 12.3  12.0 - 15.0 g/dL Final     HCT  Date Value Range Status  09/10/2012 35.7* 36.0 - 46.0 % Final    Physical Exam:  General: alert and no distress Lochia: appropriate Uterine Fundus: firm  Discharge Diagnoses: Term Pregnancy-delivered  Discharge Information: Date: 09/11/2012 Activity: pelvic rest Diet: routine Medications: PNV, Ibuprofen and Percocet Condition: stable Instructions: refer to practice specific booklet Discharge to: home Follow-up Information   Follow up with [name],Laurie Sabol, MD. Schedule an appointment as soon as possible for a visit in 6 weeks.   Specialty:  Obstetrics and Gynecology   Contact information:   [address] [place] [address] [phone]       Newborn Data: Live born female  Birth Weight: 7 lb 6.9 oz (3370 g) APGAR: 8, 9  Home with mother.  [name],Laurie [name] 09/11/2012, 8:06 AM

## 2012-09-18 ENCOUNTER — Ambulatory Visit (HOSPITAL_COMMUNITY): Payer: BC Managed Care – PPO

## 2012-10-03 ENCOUNTER — Other Ambulatory Visit: Payer: Self-pay | Admitting: Orthopedic Surgery

## 2012-10-04 ENCOUNTER — Encounter (HOSPITAL_BASED_OUTPATIENT_CLINIC_OR_DEPARTMENT_OTHER)
Admission: RE | Disposition: A | Discharge status: Home / Self Care | Payer: Self-pay | Source: Ambulatory Visit | Attending: Orthopedic Surgery

## 2012-10-04 ENCOUNTER — Ambulatory Visit (HOSPITAL_BASED_OUTPATIENT_CLINIC_OR_DEPARTMENT_OTHER)
Admission: RE | Admit: 2012-10-04 | Discharge: 2012-10-04 | Disposition: A | Discharge status: Home / Self Care | Payer: BC Managed Care – PPO | Source: Ambulatory Visit | Attending: Orthopedic Surgery | Admitting: Orthopedic Surgery

## 2012-10-04 ENCOUNTER — Encounter (HOSPITAL_BASED_OUTPATIENT_CLINIC_OR_DEPARTMENT_OTHER): Payer: Self-pay | Admitting: Anesthesiology

## 2012-10-04 ENCOUNTER — Encounter (HOSPITAL_BASED_OUTPATIENT_CLINIC_OR_DEPARTMENT_OTHER): Payer: Self-pay | Admitting: *Deleted

## 2012-10-04 ENCOUNTER — Ambulatory Visit (HOSPITAL_BASED_OUTPATIENT_CLINIC_OR_DEPARTMENT_OTHER): Payer: BC Managed Care – PPO | Admitting: Anesthesiology

## 2012-10-04 DIAGNOSIS — Z79899 Other long term (current) drug therapy: Secondary | ICD-10-CM | POA: Insufficient documentation

## 2012-10-04 DIAGNOSIS — Q447 Other congenital malformation of liver, unspecified: Secondary | ICD-10-CM | POA: Insufficient documentation

## 2012-10-04 DIAGNOSIS — Q445 Other congenital malformations of bile ducts: Secondary | ICD-10-CM | POA: Insufficient documentation

## 2012-10-04 DIAGNOSIS — Q441 Other congenital malformations of gallbladder: Secondary | ICD-10-CM | POA: Insufficient documentation

## 2012-10-04 DIAGNOSIS — G5600 Carpal tunnel syndrome, unspecified upper limb: Secondary | ICD-10-CM | POA: Insufficient documentation

## 2012-10-04 DIAGNOSIS — F988 Other specified behavioral and emotional disorders with onset usually occurring in childhood and adolescence: Secondary | ICD-10-CM | POA: Insufficient documentation

## 2012-10-04 HISTORY — PX: CARPAL TUNNEL RELEASE: SHX101

## 2012-10-04 SURGERY — CARPAL TUNNEL RELEASE
Anesthesia: Monitor Anesthesia Care | Site: Wrist | Laterality: Left | Wound class: Clean

## 2012-10-04 MED ORDER — LACTATED RINGERS IV SOLN
INTRAVENOUS | Status: DC
  Administered 2012-10-04: 09:00:00 via INTRAVENOUS
  Administered 2012-10-04: 20 mL/h via INTRAVENOUS

## 2012-10-04 MED ORDER — OXYCODONE HCL 5 MG/5ML PO SOLN: 5.0000 mg | Freq: Once | ORAL | Status: DC | PRN

## 2012-10-04 MED ORDER — BUPIVACAINE HCL (PF) 0.25 % IJ SOLN
INTRAMUSCULAR | Status: DC | PRN
  Administered 2012-10-04: 10 mL

## 2012-10-04 MED ORDER — LIDOCAINE HCL (CARDIAC) 20 MG/ML IV SOLN
INTRAVENOUS | Status: DC | PRN
  Administered 2012-10-04: 50 mg via INTRAVENOUS

## 2012-10-04 MED ORDER — ONDANSETRON HCL 4 MG/2ML IJ SOLN: 4.0000 mg | Freq: Once | INTRAMUSCULAR | Status: DC | PRN

## 2012-10-04 MED ORDER — HYDROCODONE-ACETAMINOPHEN 5-325 MG PO TABS: ORAL_TABLET | ORAL | Status: DC

## 2012-10-04 MED ORDER — ONDANSETRON HCL 4 MG/2ML IJ SOLN
INTRAMUSCULAR | Status: DC | PRN
  Administered 2012-10-04: 4 mg via INTRAVENOUS

## 2012-10-04 MED ORDER — MIDAZOLAM HCL 5 MG/5ML IJ SOLN
INTRAMUSCULAR | Status: DC | PRN
  Administered 2012-10-04: 0.5 mg via INTRAVENOUS
  Administered 2012-10-04: 1 mg via INTRAVENOUS
  Administered 2012-10-04: 0.5 mg via INTRAVENOUS

## 2012-10-04 MED ORDER — FENTANYL CITRATE 0.05 MG/ML IJ SOLN
INTRAMUSCULAR | Status: DC | PRN
  Administered 2012-10-04: 50 ug via INTRAVENOUS
  Administered 2012-10-04 (×2): 25 ug via INTRAVENOUS

## 2012-10-04 MED ORDER — OXYCODONE HCL 5 MG PO TABS: 5.0000 mg | ORAL_TABLET | Freq: Once | ORAL | Status: DC | PRN

## 2012-10-04 MED ORDER — CEFAZOLIN SODIUM-DEXTROSE 2-3 GM-% IV SOLR: 2.0000 g | INTRAVENOUS | Status: DC

## 2012-10-04 MED ORDER — PROPOFOL INFUSION 10 MG/ML OPTIME
INTRAVENOUS | Status: DC | PRN
  Administered 2012-10-04: 80 ug/kg/min via INTRAVENOUS

## 2012-10-04 MED ORDER — CHLORHEXIDINE GLUCONATE 4 % EX LIQD: 60.0000 mL | Freq: Once | CUTANEOUS | Status: DC

## 2012-10-04 MED ORDER — HYDROMORPHONE HCL PF 1 MG/ML IJ SOLN
0.2500 mg | INTRAMUSCULAR | Status: DC | PRN
Start: 2012-10-04 — End: 2012-10-04

## 2012-10-04 SURGICAL SUPPLY — 36 items
BANDAGE ELASTIC 3 VELCRO ST LF (GAUZE/BANDAGES/DRESSINGS) ×2 IMPLANT
BANDAGE GAUZE ELAST BULKY 4 IN (GAUZE/BANDAGES/DRESSINGS) ×2 IMPLANT
BLADE MINI RND TIP GREEN BEAV (BLADE) IMPLANT
BLADE SURG 15 STRL LF DISP TIS (BLADE) ×2 IMPLANT
BLADE SURG 15 STRL SS (BLADE) ×2
BNDG ESMARK 4X9 LF (GAUZE/BANDAGES/DRESSINGS) IMPLANT
CHLORAPREP W/TINT 26ML (MISCELLANEOUS) ×2 IMPLANT
CLOTH BEACON ORANGE TIMEOUT ST (SAFETY) ×2 IMPLANT
CORDS BIPOLAR (ELECTRODE) ×2 IMPLANT
COVER MAYO STAND STRL (DRAPES) ×2 IMPLANT
COVER TABLE BACK 60X90 (DRAPES) ×2 IMPLANT
CUFF TOURNIQUET SINGLE 18IN (TOURNIQUET CUFF) ×2 IMPLANT
DRAPE EXTREMITY T 121X128X90 (DRAPE) ×2 IMPLANT
DRAPE SURG 17X23 STRL (DRAPES) ×2 IMPLANT
DRSG PAD ABDOMINAL 8X10 ST (GAUZE/BANDAGES/DRESSINGS) ×2 IMPLANT
GAUZE XEROFORM 1X8 LF (GAUZE/BANDAGES/DRESSINGS) ×2 IMPLANT
GLOVE BIO SURGEON STRL SZ7.5 (GLOVE) ×2 IMPLANT
GLOVE BIOGEL PI IND STRL 7.0 (GLOVE) ×1 IMPLANT
GLOVE BIOGEL PI IND STRL 8 (GLOVE) ×1 IMPLANT
GLOVE BIOGEL PI INDICATOR 7.0 (GLOVE) ×1
GLOVE BIOGEL PI INDICATOR 8 (GLOVE) ×1
GLOVE ECLIPSE 6.5 STRL STRAW (GLOVE) ×2 IMPLANT
GOWN BRE IMP PREV XXLGXLNG (GOWN DISPOSABLE) ×2 IMPLANT
GOWN PREVENTION PLUS XLARGE (GOWN DISPOSABLE) ×2 IMPLANT
NEEDLE HYPO 25X1 1.5 SAFETY (NEEDLE) IMPLANT
NS IRRIG 1000ML POUR BTL (IV SOLUTION) ×2 IMPLANT
PACK BASIN DAY SURGERY FS (CUSTOM PROCEDURE TRAY) ×2 IMPLANT
PADDING CAST ABS 4INX4YD NS (CAST SUPPLIES)
PADDING CAST ABS COTTON 4X4 ST (CAST SUPPLIES) IMPLANT
SPONGE GAUZE 4X4 12PLY (GAUZE/BANDAGES/DRESSINGS) ×2 IMPLANT
STOCKINETTE 4X48 STRL (DRAPES) ×2 IMPLANT
SUT ETHILON 4 0 PS 2 18 (SUTURE) ×2 IMPLANT
SYR BULB 3OZ (MISCELLANEOUS) ×2 IMPLANT
SYR CONTROL 10ML LL (SYRINGE) ×2 IMPLANT
TOWEL OR 17X24 6PK STRL BLUE (TOWEL DISPOSABLE) ×2 IMPLANT
UNDERPAD 30X30 INCONTINENT (UNDERPADS AND DIAPERS) ×2 IMPLANT

## 2012-10-04 NOTE — H&P (Signed)
Laurie [name] is an 33 y.o. female.   Chief Complaint: carpal tunnel syndrome HPI: 32 yo rhd female with pins and needles sensation bilateral hands.  Started in 2007 and worsened with pregnancy in third trimester.  Issue with dropping and dexterity.  Nocturnal symptoms 3x/wk.  Positive nerve conduction studies.  She wishes to have carpal tunnel release for management of symptoms.  Past Medical History  Diagnosis Date  . Pancreatitis   . Dysrhythmia     PVCs  . Acne     accutane in college  . ADD (attention deficit disorder)   . Hx of pancreatitis   . Congenital defect     bile duct  . Normal pregnancy, first 09/09/2012  . SVD (spontaneous vaginal delivery) 09/11/2012    Past Surgical History  Procedure Laterality Date  . Sphincterectomy    . Wisdom tooth extraction    . Ercp  2009    Family History  Problem Relation Age of Onset  . Hypertension Mother   . COPD Maternal Grandmother    Social History:  reports that she has never smoked. She does not have any smokeless tobacco history on file. She reports that she does not drink alcohol or use illicit drugs.  Allergies: No Known Allergies  Medications Prior to Admission  Medication Sig Dispense Refill  . ibuprofen (ADVIL,MOTRIN) 800 MG tablet Take 1 tablet (800 mg total) by mouth every 8 (eight) hours as needed for pain.  45 tablet  1  . oxyCODONE-acetaminophen (PERCOCET/ROXICET) 5-325 MG per tablet Take 1-2 tablets by mouth every 6 (six) hours as needed.  15 tablet  0  . calcium carbonate (TUMS - DOSED IN MG ELEMENTAL CALCIUM) 500 MG chewable tablet Chew 1 tablet by mouth daily as needed for heartburn.      . Prenatal Vit-Fe Fumarate-FA (PRENATAL MULTIVITAMIN) TABS tablet Take 1 tablet by mouth at bedtime.  30 tablet  12    No results found for this or any previous visit (from the past 48 hour(s)).  No results found.   A comprehensive review of systems was negative.  Blood pressure 118/89, pulse 73, temperature 98.8  F (37.1 C), temperature source Oral, resp. rate 18, height 5\' 7"  (1.702 m), weight 178 lb (80.74 kg), SpO2 99.00%, currently breastfeeding.  General appearance: alert, cooperative and appears stated age Head: Normocephalic, without obvious abnormality, atraumatic Neck: supple, symmetrical, trachea midline Resp: clear to auscultation bilaterally Cardio: regular rate and rhythm GI: non tender Extremities: intact sensation and capillary refill all digits.  positive tinels, phalens, durkins bilaterally.  skin intact. Pulses: 2+ and symmetric Skin: Skin color, texture, turgor normal. No rashes or lesions Neurologic: Grossly normal Incision/Wound: na  Assessment/Plan Bilateral carpal tunnel syndrome.  Non operative and operative treatment options were discussed with the patient and patient wishes to proceed with operative treatment. Risks, benefits, and alternatives of surgery were discussed and the patient agrees with the plan of care. She has chosen to proceed with left carpal tunnel release first.  Kai Railsback R 10/04/2012, 9:03 AM

## 2012-10-04 NOTE — Brief Op Note (Signed)
10/04/2012  10:01 AM  PATIENT:  Laurie [name]  32 y.o. female  PRE-OPERATIVE DIAGNOSIS:  RIGHT CARPAL TUNNEL SYNDROME  POST-OPERATIVE DIAGNOSIS:  RIGHT CARPAL TUNNEL SYNDROME  PROCEDURE:  Procedure(s): CARPAL TUNNEL RELEASE  SURGEON:  Surgeon(s): Tami Ribas, MD  PHYSICIAN ASSISTANT:   ASSISTANTS: none   ANESTHESIA:   Bier block  EBL:     DRAINS: none   LOCAL MEDICATIONS USED:  MARCAINE     SPECIMEN:  No Specimen  DISPOSITION OF SPECIMEN:  N/A  COUNTS:  YES  TOURNIQUET:   Total Tourniquet Time Documented: Forearm (Left) - 26 minutes Total: Forearm (Left) - 26 minutes   DICTATION: .Other Dictation: Dictation Number [id number]  PLAN OF CARE: Discharge to home after PACU

## 2012-10-04 NOTE — Op Note (Signed)
[id number] 

## 2012-10-04 NOTE — Transfer of Care (Signed)
Immediate Anesthesia Transfer of Care Note  Patient: Laurie [name]  Procedure(s) Performed: Procedure(s): CARPAL TUNNEL RELEASE (Left)  Patient Location: PACU  Anesthesia Type:Bier block  Level of Consciousness: awake, alert  and oriented  Airway & Oxygen Therapy: Patient Spontanous Breathing and Patient connected to face mask oxygen  Post-op Assessment: Report given to PACU RN and Post -op Vital signs reviewed and stable  Post vital signs: Reviewed and stable  Complications: No apparent anesthesia complications

## 2012-10-04 NOTE — Anesthesia Preprocedure Evaluation (Signed)
Anesthesia Evaluation  Patient identified by MRN, date of birth, ID band Patient awake    Reviewed: Allergy & Precautions, H&P , NPO status , Patient's Chart, lab work & pertinent test results  Airway Mallampati: I TM Distance: >3 FB Neck ROM: Full    Dental  (+) Teeth Intact and Dental Advisory Given   Pulmonary  breath sounds clear to auscultation        Cardiovascular Rhythm:Regular Rate:Normal     Neuro/Psych    GI/Hepatic   Endo/Other    Renal/GU      Musculoskeletal   Abdominal   Peds  Hematology   Anesthesia Other Findings   Reproductive/Obstetrics                           Anesthesia Physical Anesthesia Plan  ASA: I  Anesthesia Plan: MAC and Bier Block   Post-op Pain Management:    Induction: Intravenous  Airway Management Planned: Simple Face Mask  Additional Equipment:   Intra-op Plan:   Post-operative Plan:   Informed Consent: I have reviewed the patients History and Physical, chart, labs and discussed the procedure including the risks, benefits and alternatives for the proposed anesthesia with the patient or authorized representative who has indicated his/her understanding and acceptance.   Dental advisory given  Plan Discussed with: CRNA and Anesthesiologist  Anesthesia Plan Comments:         Anesthesia Quick Evaluation

## 2012-10-05 ENCOUNTER — Encounter (HOSPITAL_BASED_OUTPATIENT_CLINIC_OR_DEPARTMENT_OTHER): Payer: Self-pay | Admitting: Orthopedic Surgery

## 2012-10-05 NOTE — Op Note (Signed)
NAME:  Laurie [name], Laurie [name]            ACCOUNT NO.:  [id number]  MEDICAL RECORD NO.:  [id number]  LOCATION:                                 FACILITY:  PHYSICIAN:  Tamula Morrical, MD        DATE OF BIRTH:  01/29/1980  DATE OF PROCEDURE:  10/04/2012 DATE OF DISCHARGE:  10/04/2012                              OPERATIVE REPORT   PREOPERATIVE DIAGNOSIS:  Left carpal tunnel syndrome.  POSTOPERATIVE DIAGNOSIS:  Left carpal tunnel syndrome.  PROCEDURE:  Left carpal tunnel release.  SURGEON:  Norrin Shreffler, MD  ASSISTANT:  None.  ANESTHESIA:  Bier block with sedation.  IV FLUIDS:  Per anesthesia flow sheet.  ESTIMATED BLOOD LOSS:  Minimal.  COMPLICATIONS:  None.  SPECIMENS:  None.  TOURNIQUET TIME:  26 minutes.  DISPOSITION:  Stable to PACU.  INDICATIONS:  [name] is a 32-year-old female who has had carpal tunnel symptoms for the past 7 years.  These worsened with her pregnancy.  She has pins and needle sensation, and nocturnal symptoms that wake her at night.  She has positive nerve conduction studies.  She wished to have carpal tunnel release for management symptoms.  Risks, benefits, and alternatives of the surgery were discussed including risk of blood loss, infection, damage to nerves, vessels, tendons, ligaments, bone; failure of surgery; need for additional surgery, complications with wound healing, continued pain, continued carpal tunnel syndrome. She voiced understanding of these risks and elected to proceed.  OPERATIVE COURSE:  After being identified preoperatively by myself, the patient and I agreed upon the procedure and site procedure.  Surgical site was marked.  The risks, benefits, and alternatives of the surgery were reviewed and she wished to proceed.  Surgical consent had been signed.  She was given IV Ancef as preoperative antibiotic prophylaxis. She was transferred to the operating room, placed in the operating room table in supine position with the left  upper extremity on an arm board. Bier block anesthesia was induced by the anesthesiologist.  Left upper extremity was prepped and draped in normal sterile orthopedic fashion. Surgical pause was performed between surgeons, anesthesia, and operating room staff, and all were in agreement as to the patient, procedure, and site of the procedure.  Tourniquet at the proximal aspect of the forearm had been inflated for the Bier block.  An incision was made over the transverse carpal ligament and carried into subcutaneous tissues by spreading technique.  Bipolar electrocautery was used to obtain hemostasis.  The palmar fascia was sharply incised.  The transverse carpal ligament was identified.  It was sharply incised distally.  Care was taken to ensure complete decompression distally.  It was then incised proximally.  Scissors were used to split the distal aspect of the volar antebrachial fascia.  The nerve was inspected.  A finger was placed into the wound and to ensure complete decompression proximally. The nerve was hyperemic and flattened.  The motor branch was identified and was intact.  The wound was copiously irrigated with sterile saline. It was closed with 4-0 nylon in a horizontal mattress fashion.  It was injected with 10 mL of 0.25% plain Marcaine to aid in postoperative analgesia.  It was dressed   with sterile Xeroform, 4 x 4s, and ABD and wrapped with Kerlix and Ace bandage.  Tourniquet was deflated at 26 minutes.  Fingertips were pink with brisk capillary refill after deflation of the tourniquet.  The operative drapes were broken down and the patient was awoken from anesthesia safely.  She was transferred back to stretcher and taken to PACU in stable condition.  See her back in the office in 1 week for postoperative followup.  We will give Norco 5/325, 1-2 p.o. every 6 hours p.r.n. pain, dispensed #30.     Pranay Hilbun, MD   ______________________________ Nazifa Trinka,  MD    KK/MEDQ  D:  10/04/2012  T:  10/05/2012  Job:  [id number] 

## 2012-10-05 NOTE — Op Note (Deleted)
Laurie [name], BLOW NO.:  [id number]  MEDICAL RECORD NO.:  [id number]  LOCATION:                                 FACILITY:  PHYSICIAN:  Laurie Loa, MD        DATE OF BIRTH:  [birth year]  DATE OF PROCEDURE:  10/04/2012 DATE OF DISCHARGE:  10/04/2012                              OPERATIVE REPORT   PREOPERATIVE DIAGNOSIS:  Left carpal tunnel syndrome.  POSTOPERATIVE DIAGNOSIS:  Left carpal tunnel syndrome.  PROCEDURE:  Left carpal tunnel release.  SURGEON:  Laurie Loa, MD  ASSISTANT:  None.  ANESTHESIA:  Bier block with sedation.  IV FLUIDS:  Per anesthesia flow sheet.  ESTIMATED BLOOD LOSS:  Minimal.  COMPLICATIONS:  None.  SPECIMENS:  None.  TOURNIQUET TIME:  26 minutes.  DISPOSITION:  Stable to PACU.  INDICATIONS:  Ms. Boultinghouse is a 32 year old female who has had carpal tunnel symptoms for the past 7 years.  These worsened with her pregnancy.  She has pins and needle sensation, and nocturnal symptoms that wake her at night.  She has positive nerve conduction studies.  She wished to have carpal tunnel release for management symptoms.  Risks, benefits, and alternatives of the surgery were discussed including risk of blood loss, infection, damage to nerves, vessels, tendons, ligaments, bone; failure of surgery; need for additional surgery, complications with wound healing, continued pain, continued carpal tunnel syndrome. She voiced understanding of these risks and elected to proceed.  OPERATIVE COURSE:  After being identified preoperatively by myself, the patient and I agreed upon the procedure and site procedure.  Surgical site was marked.  The risks, benefits, and alternatives of the surgery were reviewed and she wished to proceed.  Surgical consent had been signed.  She was given IV Ancef as preoperative antibiotic prophylaxis. She was transferred to the operating room, placed in the operating room table in supine position with the left  upper extremity on an arm board. Bier block anesthesia was induced by the anesthesiologist.  Left upper extremity was prepped and draped in normal sterile orthopedic fashion. Surgical pause was performed between surgeons, anesthesia, and operating room staff, and all were in agreement as to the patient, procedure, and site of the procedure.  Tourniquet at the proximal aspect of the forearm had been inflated for the Bier block.  An incision was made over the transverse carpal ligament and carried into subcutaneous tissues by spreading technique.  Bipolar electrocautery was used to obtain hemostasis.  The palmar fascia was sharply incised.  The transverse carpal ligament was identified.  It was sharply incised distally.  Care was taken to ensure complete decompression distally.  It was then incised proximally.  Scissors were used to split the distal aspect of the volar antebrachial fascia.  The nerve was inspected.  A finger was placed into the wound and to ensure complete decompression proximally. The nerve was hyperemic and flattened.  The motor branch was identified and was intact.  The wound was copiously irrigated with sterile saline. It was closed with 4-0 nylon in a horizontal mattress fashion.  It was injected with 10 mL of 0.25% plain Marcaine to aid in postoperative analgesia.  It was dressed  with sterile Xeroform, 4 x 4s, and ABD and wrapped with Kerlix and Ace bandage.  Tourniquet was deflated at 26 minutes.  Fingertips were pink with brisk capillary refill after deflation of the tourniquet.  The operative drapes were broken down and the patient was awoken from anesthesia safely.  She was transferred back to stretcher and taken to PACU in stable condition.  See her back in the office in 1 week for postoperative followup.  We will give Norco 5/325, 1-2 p.o. every 6 hours p.r.n. pain, dispensed #30.     Laurie Loa, MD   ______________________________ Laurie Loa,  MD    KK/MEDQ  D:  10/04/2012  T:  10/05/2012  Job:  [id number]

## 2012-10-05 NOTE — Anesthesia Postprocedure Evaluation (Signed)
  Anesthesia Post-op Note  Patient: Laurie [name]  Procedure(s) Performed: Procedure(s): CARPAL TUNNEL RELEASE (Left)  Patient Location: PACU  Anesthesia Type:MAC and Bier block  Level of Consciousness: awake, alert  and oriented  Airway and Oxygen Therapy: Patient Spontanous Breathing  Post-op Pain: mild  Post-op Assessment: Post-op Vital signs reviewed  Post-op Vital Signs: Reviewed  Complications: No apparent anesthesia complications

## 2012-11-07 ENCOUNTER — Ambulatory Visit (INDEPENDENT_AMBULATORY_CARE_PROVIDER_SITE_OTHER): Payer: BC Managed Care – PPO

## 2012-11-07 ENCOUNTER — Ambulatory Visit (INDEPENDENT_AMBULATORY_CARE_PROVIDER_SITE_OTHER): Payer: BC Managed Care – PPO | Admitting: Podiatry

## 2012-11-07 ENCOUNTER — Encounter: Payer: Self-pay | Admitting: Podiatry

## 2012-11-07 VITALS — BP 116/79 | HR 79 | Resp 16 | Ht 66.0 in | Wt 185.0 lb

## 2012-11-07 DIAGNOSIS — M722 Plantar fascial fibromatosis: Secondary | ICD-10-CM

## 2012-11-07 MED ORDER — TRIAMCINOLONE ACETONIDE 10 MG/ML IJ SUSP
5.0000 mg | Freq: Once | INTRAMUSCULAR | Status: AC
  Administered 2012-11-07: 5 mg via INTRA_ARTICULAR

## 2012-11-07 NOTE — Patient Instructions (Signed)
Plantar Fasciitis (Heel Spur Syndrome)  with Rehab  The plantar fascia is a fibrous, ligament-like, soft-tissue structure that spans the bottom of the foot. Plantar fasciitis is a condition that causes pain in the foot due to inflammation of the tissue.  SYMPTOMS   · Pain and tenderness on the underneath side of the foot.  · Pain that worsens with standing or walking.  CAUSES   Plantar fasciitis is caused by irritation and injury to the plantar fascia on the underneath side of the foot. Common mechanisms of injury include:  · Direct trauma to bottom of the foot.  · Damage to a small nerve that runs under the foot where the main fascia attaches to the heel bone.  · Stress placed on the plantar fascia due to bone spurs.  RISK INCREASES WITH:   · Activities that place stress on the plantar fascia (running, jumping, pivoting, or cutting).  · Poor strength and flexibility.  · Improperly fitted shoes.  · Tight calf muscles.  · Flat feet.  · Failure to warm-up properly before activity.  · Obesity.  PREVENTION  · Warm up and stretch properly before activity.  · Allow for adequate recovery between workouts.  · Maintain physical fitness:  · Strength, flexibility, and endurance.  · Cardiovascular fitness.  · Maintain a health body weight.  · Avoid stress on the plantar fascia.  · Wear properly fitted shoes, including arch supports for individuals who have flat feet.  PROGNOSIS   If treated properly, then the symptoms of plantar fasciitis usually resolve without surgery. However, occasionally surgery is necessary.  RELATED COMPLICATIONS   · Recurrent symptoms that may result in a chronic condition.  · Problems of the lower back that are caused by compensating for the injury, such as limping.  · Pain or weakness of the foot during push-off following surgery.  · Chronic inflammation, scarring, and partial or complete fascia tear, occurring more often from repeated injections.  TREATMENT   Treatment initially involves the use of  ice and medication to help reduce pain and inflammation. The use of strengthening and stretching exercises may help reduce pain with activity, especially stretches of the Achilles tendon. These exercises may be performed at home or with a therapist. Your caregiver may recommend that you use heel cups of arch supports to help reduce stress on the plantar fascia. Occasionally, corticosteroid injections are given to reduce inflammation. If symptoms persist for greater than 6 months despite non-surgical (conservative), then surgery may be recommended.   MEDICATION   · If pain medication is necessary, then nonsteroidal anti-inflammatory medications, such as aspirin and ibuprofen, or other minor pain relievers, such as acetaminophen, are often recommended.  · Do not take pain medication within 7 days before surgery.  · Prescription pain relievers may be given if deemed necessary by your caregiver. Use only as directed and only as much as you need.  · Corticosteroid injections may be given by your caregiver. These injections should be reserved for the most serious cases, because they may only be given a certain number of times.  HEAT AND COLD  · Cold treatment (icing) relieves pain and reduces inflammation. Cold treatment should be applied for 10 to 15 minutes every 2 to 3 hours for inflammation and pain and immediately after any activity that aggravates your symptoms. Use ice packs or massage the area with a piece of ice (ice massage).  · Heat treatment may be used prior to performing the stretching and strengthening activities prescribed   by your caregiver, physical therapist, or athletic trainer. Use a heat pack or soak the injury in warm water.  SEEK IMMEDIATE MEDICAL CARE IF:  · Treatment seems to offer no benefit, or the condition worsens.  · Any medications produce adverse side effects.  EXERCISES  RANGE OF MOTION (ROM) AND STRETCHING EXERCISES - Plantar Fasciitis (Heel Spur Syndrome)  These exercises may help you  when beginning to rehabilitate your injury. Your symptoms may resolve with or without further involvement from your physician, physical therapist or athletic trainer. While completing these exercises, remember:   · Restoring tissue flexibility helps normal motion to return to the joints. This allows healthier, less painful movement and activity.  · An effective stretch should be held for at least 30 seconds.  · A stretch should never be painful. You should only feel a gentle lengthening or release in the stretched tissue.  RANGE OF MOTION - Toe Extension, Flexion  · Sit with your right / left leg crossed over your opposite knee.  · Grasp your toes and gently pull them back toward the top of your foot. You should feel a stretch on the bottom of your toes and/or foot.  · Hold this stretch for __________ seconds.  · Now, gently pull your toes toward the bottom of your foot. You should feel a stretch on the top of your toes and or foot.  · Hold this stretch for __________ seconds.  Repeat __________ times. Complete this stretch __________ times per day.   RANGE OF MOTION - Ankle Dorsiflexion, Active Assisted  · Remove shoes and sit on a chair that is preferably not on a carpeted surface.  · Place right / left foot under knee. Extend your opposite leg for support.  · Keeping your heel down, slide your right / left foot back toward the chair until you feel a stretch at your ankle or calf. If you do not feel a stretch, slide your bottom forward to the edge of the chair, while still keeping your heel down.  · Hold this stretch for __________ seconds.  Repeat __________ times. Complete this stretch __________ times per day.   STRETCH  Gastroc, Standing  · Place hands on wall.  · Extend right / left leg, keeping the front knee somewhat bent.  · Slightly point your toes inward on your back foot.  · Keeping your right / left heel on the floor and your knee straight, shift your weight toward the wall, not allowing your back to  arch.  · You should feel a gentle stretch in the right / left calf. Hold this position for __________ seconds.  Repeat __________ times. Complete this stretch __________ times per day.  STRETCH  Soleus, Standing  · Place hands on wall.  · Extend right / left leg, keeping the other knee somewhat bent.  · Slightly point your toes inward on your back foot.  · Keep your right / left heel on the floor, bend your back knee, and slightly shift your weight over the back leg so that you feel a gentle stretch deep in your back calf.  · Hold this position for __________ seconds.  Repeat __________ times. Complete this stretch __________ times per day.  STRETCH  Gastrocsoleus, Standing   Note: This exercise can place a lot of stress on your foot and ankle. Please complete this exercise only if specifically instructed by your caregiver.   · Place the ball of your right / left foot on a step, keeping   your other foot firmly on the same step.  · Hold on to the wall or a rail for balance.  · Slowly lift your other foot, allowing your body weight to press your heel down over the edge of the step.  · You should feel a stretch in your right / left calf.  · Hold this position for __________ seconds.  · Repeat this exercise with a slight bend in your right / left knee.  Repeat __________ times. Complete this stretch __________ times per day.   STRENGTHENING EXERCISES - Plantar Fasciitis (Heel Spur Syndrome)   These exercises may help you when beginning to rehabilitate your injury. They may resolve your symptoms with or without further involvement from your physician, physical therapist or athletic trainer. While completing these exercises, remember:   · Muscles can gain both the endurance and the strength needed for everyday activities through controlled exercises.  · Complete these exercises as instructed by your physician, physical therapist or athletic trainer. Progress the resistance and repetitions only as guided.  STRENGTH - Towel  Curls  · Sit in a chair positioned on a non-carpeted surface.  · Place your foot on a towel, keeping your heel on the floor.  · Pull the towel toward your heel by only curling your toes. Keep your heel on the floor.  · If instructed by your physician, physical therapist or athletic trainer, add ____________________ at the end of the towel.  Repeat __________ times. Complete this exercise __________ times per day.  STRENGTH - Ankle Inversion  · Secure one end of a rubber exercise band/tubing to a fixed object (table, pole). Loop the other end around your foot just before your toes.  · Place your fists between your knees. This will focus your strengthening at your ankle.  · Slowly, pull your big toe up and in, making sure the band/tubing is positioned to resist the entire motion.  · Hold this position for __________ seconds.  · Have your muscles resist the band/tubing as it slowly pulls your foot back to the starting position.  Repeat __________ times. Complete this exercises __________ times per day.   Document Released: 12/20/2004 Document Revised: 03/14/2011 Document Reviewed: 04/03/2008  [employer]® Patient Information ©2014 [employer].

## 2012-11-07 NOTE — Progress Notes (Signed)
Subjective:     Patient ID: Laurie [name], female   DOB: 1980/10/01, 32 y.o.   MRN: [id number]  Foot Pain   patient presents stating she is having pain in her right heel of 4 months duration. States that she has had numerous attacks over the years but this is the worse one. Just had a child 8 weeks ago   Review of Systems  All other systems reviewed and are negative.       Objective:   Physical Exam  Constitutional: She is oriented to person, place, and time.  Cardiovascular: Intact distal pulses.   Musculoskeletal: Normal range of motion.  Neurological: She is oriented to person, place, and time.  Skin: Skin is warm.   patient right heel is very tender in the medial area at the tubercle. Patient's neurovascular status is intact with no equinus condition noted     Assessment:     Plantar fasciitis right upper long-term nature    Plan:     Explained plantar fasciitis in the acute and chronic elements of it. Discussed long-term orthotics and reviewed x-rays with patient today. Injected the plantar fascia 3 mg Kenalog 5 mg Xylocaine Marcaine mixture and dispensed night splint with instruction and reappoint in 1 week

## 2012-11-07 NOTE — Progress Notes (Signed)
  Subjective:    Patient ID: Laurie [name], female    DOB: [birth year], 32 y.o.   MRN: [id number]  HPI Comments: Plantar heel right  N aches L plantar heel right D several mos O gradual  C AM pain, worse, nurse at [employer] A walking T tried different shoes, Ibuprofen 800mg    Foot Pain      Review of Systems  All other systems reviewed and are negative.       Objective:   Physical Exam        Assessment & Plan:

## 2012-11-19 ENCOUNTER — Ambulatory Visit (INDEPENDENT_AMBULATORY_CARE_PROVIDER_SITE_OTHER): Payer: BC Managed Care – PPO | Admitting: Podiatry

## 2012-11-19 ENCOUNTER — Encounter: Payer: Self-pay | Admitting: Podiatry

## 2012-11-19 VITALS — BP 125/80 | HR 76 | Resp 12

## 2012-11-19 DIAGNOSIS — M722 Plantar fascial fibromatosis: Secondary | ICD-10-CM

## 2012-11-19 MED ORDER — TRIAMCINOLONE ACETONIDE 10 MG/ML IJ SUSP
5.0000 mg | Freq: Once | INTRAMUSCULAR | Status: AC
  Administered 2012-11-19: 5 mg via INTRA_ARTICULAR

## 2012-11-19 MED ORDER — DICLOFENAC SODIUM 75 MG PO TBEC: 75.0000 mg | DELAYED_RELEASE_TABLET | Freq: Two times a day (BID) | ORAL | Status: DC

## 2012-11-20 NOTE — Progress Notes (Signed)
Subjective:     Patient ID: Laurie [name], female   DOB: 1980/02/16, 32 y.o.   MRN: [id number]  HPI patient presents stating my heel is some better it's still quite sore especially when I'm active   Review of Systems     Objective:   Physical Exam  Nursing note and vitals reviewed. Constitutional: She is oriented to person, place, and time. She appears well-developed and well-nourished.  Cardiovascular: Intact distal pulses.   Neurological: She is oriented to person, place, and time.   patient's right heel remains tender when pressed with some improvement from previous visit    Assessment:     Plantar fasciitis noted right heel still present with long-term chronic instability of the arch    Plan:     Reviewed condition and recommended further injection treatment and long-term orthotics that I educated her on today. Prepped the area and injected 3 mg Kenalog 5 mg Xylocaine Marcaine mixture and scanned for custom orthotic devices

## 2012-11-27 ENCOUNTER — Other Ambulatory Visit: Payer: Self-pay | Admitting: Orthopedic Surgery

## 2012-12-11 ENCOUNTER — Encounter (INDEPENDENT_AMBULATORY_CARE_PROVIDER_SITE_OTHER): Payer: Self-pay | Admitting: General Surgery

## 2012-12-11 ENCOUNTER — Ambulatory Visit (INDEPENDENT_AMBULATORY_CARE_PROVIDER_SITE_OTHER): Payer: BC Managed Care – PPO | Admitting: General Surgery

## 2012-12-11 VITALS — BP 112/72 | HR 80 | Temp 98.8°F | Resp 18 | Ht 66.0 in | Wt 181.0 lb

## 2012-12-11 DIAGNOSIS — D2360 Other benign neoplasm of skin of unspecified upper limb, including shoulder: Secondary | ICD-10-CM

## 2012-12-11 DIAGNOSIS — D2362 Other benign neoplasm of skin of left upper limb, including shoulder: Secondary | ICD-10-CM | POA: Insufficient documentation

## 2012-12-11 NOTE — H&P (Signed)
Laurie [name] is an 32 y.o. female.   Chief Complaint: Painful and easily trauma with bleeding left upper back lesion. 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 There is no malignancy however recently has become increasingly painful and also bleeds very easily when rubbing against clothing and other objects.  She's recently postpartum and is having more discomfort in that area.  Past Medical History  Diagnosis Date  . Pancreatitis   . Dysrhythmia     PVCs  . Acne     accutane in college  . ADD (attention deficit disorder)   . Hx of pancreatitis   . Congenital defect     bile duct  . Normal pregnancy, first 09/09/2012  . SVD (spontaneous vaginal delivery) 09/11/2012    Past Surgical History  Procedure Laterality Date  . Sphincterectomy    . Wisdom tooth extraction    . Ercp  2009  . Carpal tunnel release Left 10/04/2012    Procedure: CARPAL TUNNEL RELEASE;  Surgeon: Tami Ribas, MD;  Location: [facility];  Service: Orthopedics;  Laterality: Left;    Family History  Problem Relation Age of Onset  . Hypertension Mother   . COPD Maternal Grandmother   . Hyperlipidemia Father    Social History:  reports that she has never smoked. She does not have any smokeless tobacco history on file. She reports that she does not drink alcohol or use illicit drugs.  Allergies: No Known Allergies   (Not in a hospital admission)  No results found for this or any previous visit (from the past 48 hour(s)). No results found.  Review of Systems  Constitutional: Negative.   Musculoskeletal: Positive for back pain (at site of skin lesion).    Blood pressure 112/72, pulse 80, temperature 98.8 F (37.1 C), resp. rate 18, height 5\' 6"  (1.676 m), weight 181 lb (82.101 kg). Physical Exam  Constitutional: She is oriented to person, place, and time. She appears well-developed and well-nourished.  HENT:    Head: Normocephalic and atraumatic.  Eyes: Pupils are equal, round, and reactive to light.  Neck: Normal range of motion. Neck supple.  Cardiovascular: Normal rate and regular rhythm.   Respiratory: Effort normal.    GI: Soft. Bowel sounds are normal.  Musculoskeletal: Normal range of motion.  Neurological: She is alert and oriented to person, place, and time.  Skin: Skin is warm and dry.  Psychiatric: Her behavior is normal. Judgment and thought content normal.     Assessment/Plan Symptomatic left upper back lesion characterized as a dermatofibroma from prior biopsy.  I would recommend removing this completely because of a mild risk of cancer and also because of current symptoms. The patient does have a family history of skin cancers. This has been increasingly more friable but otherwise has not changed colors very much.  Cherylynn Ridges 12/11/2012, 9:24 AM

## 2012-12-11 NOTE — Progress Notes (Signed)
This patient's of his evaluation was documented as a preoperative history and physical for future surgery.

## 2012-12-12 ENCOUNTER — Encounter (HOSPITAL_BASED_OUTPATIENT_CLINIC_OR_DEPARTMENT_OTHER): Payer: Self-pay | Admitting: *Deleted

## 2012-12-18 ENCOUNTER — Ambulatory Visit (HOSPITAL_BASED_OUTPATIENT_CLINIC_OR_DEPARTMENT_OTHER)
Admission: RE | Admit: 2012-12-18 | Discharge: 2012-12-18 | Disposition: A | Discharge status: Home / Self Care | Payer: BC Managed Care – PPO | Source: Ambulatory Visit | Attending: General Surgery | Admitting: General Surgery

## 2012-12-18 ENCOUNTER — Ambulatory Visit (HOSPITAL_BASED_OUTPATIENT_CLINIC_OR_DEPARTMENT_OTHER): Payer: BC Managed Care – PPO | Admitting: Anesthesiology

## 2012-12-18 ENCOUNTER — Encounter (HOSPITAL_BASED_OUTPATIENT_CLINIC_OR_DEPARTMENT_OTHER)
Admission: RE | Disposition: A | Discharge status: Home / Self Care | Payer: Self-pay | Source: Ambulatory Visit | Attending: General Surgery

## 2012-12-18 ENCOUNTER — Encounter (HOSPITAL_BASED_OUTPATIENT_CLINIC_OR_DEPARTMENT_OTHER): Payer: Self-pay | Admitting: Anesthesiology

## 2012-12-18 ENCOUNTER — Encounter (HOSPITAL_BASED_OUTPATIENT_CLINIC_OR_DEPARTMENT_OTHER): Payer: BC Managed Care – PPO | Admitting: Anesthesiology

## 2012-12-18 DIAGNOSIS — M549 Dorsalgia, unspecified: Secondary | ICD-10-CM | POA: Insufficient documentation

## 2012-12-18 DIAGNOSIS — D2362 Other benign neoplasm of skin of left upper limb, including shoulder: Secondary | ICD-10-CM

## 2012-12-18 DIAGNOSIS — D235 Other benign neoplasm of skin of trunk: Secondary | ICD-10-CM | POA: Insufficient documentation

## 2012-12-18 DIAGNOSIS — D2110 Benign neoplasm of connective and other soft tissue of unspecified upper limb, including shoulder: Secondary | ICD-10-CM

## 2012-12-18 DIAGNOSIS — I4949 Other premature depolarization: Secondary | ICD-10-CM | POA: Insufficient documentation

## 2012-12-18 DIAGNOSIS — F988 Other specified behavioral and emotional disorders with onset usually occurring in childhood and adolescence: Secondary | ICD-10-CM | POA: Insufficient documentation

## 2012-12-18 HISTORY — PX: MASS EXCISION: SHX2000

## 2012-12-18 LAB — POCT HEMOGLOBIN-HEMACUE: Hemoglobin: 15.6 g/dL — ABNORMAL HIGH (ref 12.0–15.0)

## 2012-12-18 SURGERY — EXCISION MASS
Anesthesia: Monitor Anesthesia Care | Site: Shoulder | Laterality: Left

## 2012-12-18 MED ORDER — MIDAZOLAM HCL 2 MG/2ML IJ SOLN: 1.0000 mg | INTRAMUSCULAR | Status: DC | PRN

## 2012-12-18 MED ORDER — HYDROMORPHONE HCL PF 1 MG/ML IJ SOLN: 0.2500 mg | INTRAMUSCULAR | Status: DC | PRN

## 2012-12-18 MED ORDER — CEFAZOLIN SODIUM 1-5 GM-% IV SOLN
INTRAVENOUS | Status: AC
  Filled 2012-12-18: qty 100

## 2012-12-18 MED ORDER — OXYCODONE HCL 5 MG/5ML PO SOLN: 5.0000 mg | Freq: Once | ORAL | Status: DC | PRN

## 2012-12-18 MED ORDER — CEFAZOLIN SODIUM-DEXTROSE 2-3 GM-% IV SOLR
2.0000 g | INTRAVENOUS | Status: AC
  Administered 2012-12-18: 2 g via INTRAVENOUS

## 2012-12-18 MED ORDER — LACTATED RINGERS IV SOLN
INTRAVENOUS | Status: DC
  Administered 2012-12-18: 13:00:00 via INTRAVENOUS

## 2012-12-18 MED ORDER — HYDROCODONE-ACETAMINOPHEN 5-325 MG PO TABS: 1.0000 | ORAL_TABLET | Freq: Four times a day (QID) | ORAL | Status: DC | PRN

## 2012-12-18 MED ORDER — PROPOFOL 10 MG/ML IV BOLUS
INTRAVENOUS | Status: AC
  Filled 2012-12-18: qty 20

## 2012-12-18 MED ORDER — POVIDONE-IODINE 10 % EX OINT
TOPICAL_OINTMENT | CUTANEOUS | Status: AC
  Filled 2012-12-18: qty 28.35

## 2012-12-18 MED ORDER — PROPOFOL INFUSION 10 MG/ML OPTIME
INTRAVENOUS | Status: DC | PRN
  Administered 2012-12-18: 75 ug/kg/min via INTRAVENOUS

## 2012-12-18 MED ORDER — FENTANYL CITRATE 0.05 MG/ML IJ SOLN: 50.0000 ug | INTRAMUSCULAR | Status: DC | PRN

## 2012-12-18 MED ORDER — LIDOCAINE HCL (CARDIAC) 20 MG/ML IV SOLN
INTRAVENOUS | Status: DC | PRN
  Administered 2012-12-18: 50 mg via INTRAVENOUS

## 2012-12-18 MED ORDER — BUPIVACAINE HCL 0.5 % IJ SOLN
INTRAMUSCULAR | Status: DC | PRN
  Administered 2012-12-18: 10.5 mL

## 2012-12-18 MED ORDER — BUPIVACAINE-EPINEPHRINE PF 0.25-1:200000 % IJ SOLN
INTRAMUSCULAR | Status: AC
  Filled 2012-12-18: qty 30

## 2012-12-18 MED ORDER — ONDANSETRON HCL 4 MG/2ML IJ SOLN: 4.0000 mg | Freq: Once | INTRAMUSCULAR | Status: DC | PRN

## 2012-12-18 MED ORDER — FENTANYL CITRATE 0.05 MG/ML IJ SOLN
INTRAMUSCULAR | Status: AC
  Filled 2012-12-18: qty 4

## 2012-12-18 MED ORDER — MIDAZOLAM HCL 2 MG/2ML IJ SOLN
INTRAMUSCULAR | Status: AC
  Filled 2012-12-18: qty 2

## 2012-12-18 MED ORDER — LIDOCAINE-EPINEPHRINE (PF) 1 %-1:200000 IJ SOLN
INTRAMUSCULAR | Status: DC | PRN
  Administered 2012-12-18: 10.5 mL

## 2012-12-18 MED ORDER — MIDAZOLAM HCL 5 MG/5ML IJ SOLN
INTRAMUSCULAR | Status: DC | PRN
  Administered 2012-12-18: 2 mg via INTRAVENOUS

## 2012-12-18 MED ORDER — ONDANSETRON HCL 4 MG/2ML IJ SOLN
INTRAMUSCULAR | Status: DC | PRN
  Administered 2012-12-18: 4 mg via INTRAVENOUS

## 2012-12-18 MED ORDER — CHLORHEXIDINE GLUCONATE 4 % EX LIQD: 1.0000 "application " | Freq: Once | CUTANEOUS | Status: DC

## 2012-12-18 MED ORDER — OXYCODONE HCL 5 MG PO TABS
5.0000 mg | ORAL_TABLET | Freq: Once | ORAL | Status: DC | PRN
Start: 2012-12-18 — End: 2012-12-18

## 2012-12-18 MED ORDER — FENTANYL CITRATE 0.05 MG/ML IJ SOLN
INTRAMUSCULAR | Status: DC | PRN
  Administered 2012-12-18: 50 ug via INTRAVENOUS

## 2012-12-18 SURGICAL SUPPLY — 49 items
BLADE SURG 15 STRL LF DISP TIS (BLADE) ×1 IMPLANT
BLADE SURG 15 STRL SS (BLADE) ×1
BLADE SURG ROTATE 9660 (MISCELLANEOUS) IMPLANT
BNDG COHESIVE 4X5 WHT NS (GAUZE/BANDAGES/DRESSINGS) IMPLANT
BNDG GAUZE ELAST 4 BULKY (GAUZE/BANDAGES/DRESSINGS) IMPLANT
CANISTER SUCT 1200ML W/VALVE (MISCELLANEOUS) IMPLANT
CHLORAPREP W/TINT 26ML (MISCELLANEOUS) ×2 IMPLANT
CLEANER CAUTERY TIP 5X5 PAD (MISCELLANEOUS) ×1 IMPLANT
COVER MAYO STAND STRL (DRAPES) ×2 IMPLANT
COVER TABLE BACK 60X90 (DRAPES) ×2 IMPLANT
DECANTER SPIKE VIAL GLASS SM (MISCELLANEOUS) IMPLANT
DERMABOND ADVANCED (GAUZE/BANDAGES/DRESSINGS) ×1
DERMABOND ADVANCED .7 DNX12 (GAUZE/BANDAGES/DRESSINGS) ×1 IMPLANT
DRAPE LAPAROTOMY T 102X78X121 (DRAPES) ×2 IMPLANT
DRAPE UTILITY XL STRL (DRAPES) ×2 IMPLANT
DRSG TEGADERM 4X4.75 (GAUZE/BANDAGES/DRESSINGS) ×2 IMPLANT
ELECT REM PT RETURN 9FT ADLT (ELECTROSURGICAL) ×2
ELECTRODE REM PT RTRN 9FT ADLT (ELECTROSURGICAL) ×1 IMPLANT
GLOVE BIOGEL PI IND STRL 7.0 (GLOVE) ×1 IMPLANT
GLOVE BIOGEL PI IND STRL 8 (GLOVE) ×1 IMPLANT
GLOVE BIOGEL PI INDICATOR 7.0 (GLOVE) ×1
GLOVE BIOGEL PI INDICATOR 8 (GLOVE) ×1
GLOVE ECLIPSE 7.5 STRL STRAW (GLOVE) ×2 IMPLANT
GLOVE SURG SS PI 7.0 STRL IVOR (GLOVE) ×2 IMPLANT
GOWN PREVENTION PLUS XLARGE (GOWN DISPOSABLE) ×6 IMPLANT
NEEDLE HYPO 25X1 1.5 SAFETY (NEEDLE) ×2 IMPLANT
NS IRRIG 1000ML POUR BTL (IV SOLUTION) IMPLANT
PACK BASIN DAY SURGERY FS (CUSTOM PROCEDURE TRAY) ×2 IMPLANT
PAD CLEANER CAUTERY TIP 5X5 (MISCELLANEOUS) ×1
PENCIL BUTTON HOLSTER BLD 10FT (ELECTRODE) ×2 IMPLANT
SPONGE GAUZE 4X4 12PLY STER LF (GAUZE/BANDAGES/DRESSINGS) ×2 IMPLANT
STRIP CLOSURE SKIN 1/4X4 (GAUZE/BANDAGES/DRESSINGS) ×2 IMPLANT
SUCTION FRAZIER TIP 10 FR DISP (SUCTIONS) IMPLANT
SUT ETHILON 4 0 PS 2 18 (SUTURE) IMPLANT
SUT MNCRL AB 3-0 PS2 18 (SUTURE) ×2 IMPLANT
SUT MON AB 4-0 PC3 18 (SUTURE) IMPLANT
SUT PROLENE 4 0 PS 2 18 (SUTURE) IMPLANT
SUT VIC AB 2-0 SH 27 (SUTURE)
SUT VIC AB 2-0 SH 27XBRD (SUTURE) IMPLANT
SUT VIC AB 3-0 SH 27 (SUTURE) ×1
SUT VIC AB 3-0 SH 27X BRD (SUTURE) ×1 IMPLANT
SUT VIC AB 4-0 SH 27 (SUTURE)
SUT VIC AB 4-0 SH 27XANBCTRL (SUTURE) IMPLANT
SYR BULB 3OZ (MISCELLANEOUS) IMPLANT
SYR CONTROL 10ML LL (SYRINGE) ×2 IMPLANT
TOWEL OR 17X24 6PK STRL BLUE (TOWEL DISPOSABLE) ×2 IMPLANT
TOWEL OR NON WOVEN STRL DISP B (DISPOSABLE) IMPLANT
TUBE CONNECTING 20X1/4 (TUBING) IMPLANT
YANKAUER SUCT BULB TIP NO VENT (SUCTIONS) IMPLANT

## 2012-12-18 NOTE — Op Note (Signed)
OPERATIVE REPORT  DATE OF OPERATION: 12/18/2012  PATIENT:  Laurie [name]  32 y.o. female  PRE-OPERATIVE DIAGNOSIS:  Painful left upper back dermatofibroma  POST-OPERATIVE DIAGNOSIS:  Painful left upper back dermatofibroma  PROCEDURE:  Procedure(s): EXCISION OF LEFT POSTERIOR  SHOULDER DERMATOFIBROMA  SURGEON:  Surgeon(s): Cherylynn Ridges, MD  ASSISTANT: None  ANESTHESIA:   local and MAC  EBL: <10 ml  BLOOD ADMINISTERED: none  DRAINS: none   SPECIMEN:  Source of Specimen:  Excised lesion from left upper back  COUNTS CORRECT:  YES  PROCEDURE DETAILS: The patient was taken to the operating room and placed on the table initially in the supine position. She was subsequently placed into the right lateral decubitus position and given some IV sedation and prepped and draped in usual sterile manner.  After a proper time out was performed identifying the patient and the procedure to be performed we measured the skin lesion which was approximately 1 and 1/2-2 cm in size and circular. We identified the lines of tension on her left upper back and made the appropriate markings using a marking pen to parallel those lines.  We took the margins of excision approximately 3 cm on each side along the tension lines but coming close to the lesion at the center,  making a total length of the incision approximately 5-6 cm. We anesthetized the area using a small gauge needle using 1% Xylocaine mixed with half percent Marcaine with epinephrine. Approximately 20 cc was used in total initially and then another 5 cc once the skin was opened and there was deeper tenderness.  We used a 15 blade to make the oval incision down into the subcutaneous tissue. We then used electrocautery to excise most the lesion down to the muscle fascia. We developed flaps circumferentially to release some of the tension using a hemostat clamp. We subsequently reapproximated the deep subcutaneous tissue using interrupted 3-0  Vicryl suture. The skin was then closed using a running subcuticular stitch of 3-0 Monocryl. This was done with minimal tension. All needle counts, sponge counts and instrument counts were correct. Dermabond Steri-Strips and Tegaderms used to complete the dressings.  PATIENT DISPOSITION:  PACU - hemodynamically stable.   Lucielle Vokes O 12/16/20142:15 PM

## 2012-12-18 NOTE — Transfer of Care (Signed)
Immediate Anesthesia Transfer of Care Note  Patient: Laurie [name]  Procedure(s) Performed: Procedure(s): EXCISION OF LEFT POSTERIOR  SHOULDER DERMATOFIBROMA (Left)  Patient Location: PACU  Anesthesia Type:MAC  Level of Consciousness: awake, alert  and oriented  Airway & Oxygen Therapy: Patient Spontanous Breathing  Post-op Assessment: Report given to PACU RN and Post -op Vital signs reviewed and stable  Post vital signs: Reviewed and stable  Complications: No apparent anesthesia complications

## 2012-12-18 NOTE — Anesthesia Postprocedure Evaluation (Signed)
  Anesthesia Post-op Note  Patient: Laurie [name]  Procedure(s) Performed: Procedure(s): EXCISION OF LEFT POSTERIOR  SHOULDER DERMATOFIBROMA (Left)  Patient Location: PACU  Anesthesia Type: MAC  Level of Consciousness: awake and alert   Airway and Oxygen Therapy: Patient Spontanous Breathing  Post-op Pain: none  Post-op Assessment: Post-op Vital signs reviewed, Patient's Cardiovascular Status Stable and Respiratory Function Stable  Post-op Vital Signs: Reviewed  Filed Vitals:   12/18/12 1415  BP: 117/71  Pulse: 74  Temp:   Resp: 13    Complications: No apparent anesthesia complications

## 2012-12-18 NOTE — Interval H&P Note (Signed)
History and Physical Interval Note:  No changes.  Old left shoulder dermatofibroma to be excised.  12/18/2012 12:40 PM  Laurie [name]  has presented today for surgery, with the diagnosis of Painful left upper back dermatofibroma  The various methods of treatment have been discussed with the patient and family. After consideration of risks, benefits and other options for treatment, the patient has consented to  Procedure(s): EXCISION OF LEFT POSTERIOR  SHOULDER DERMATOFIBROMA (Left) as a surgical intervention .  The patient's history has been reviewed, patient examined, no change in status, stable for surgery.  I have reviewed the patient's chart and labs.  Questions were answered to the patient's satisfaction.     Ferrel Simington, Marta Lamas

## 2012-12-18 NOTE — H&P (View-Only) (Signed)
Laurie [name] is an 32 y.[name]. female.   Chief Complaint: Painful and easily trauma with bleeding left upper back lesion. 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 There is no malignancy however recently has become increasingly painful and also bleeds very easily when rubbing against clothing and other objects.  She's recently postpartum and is having more discomfort in that area.  Past Medical History  Diagnosis Date  . Pancreatitis   . Dysrhythmia     PVCs  . Acne     accutane in college  . ADD (attention deficit disorder)   . Hx of pancreatitis   . Congenital defect     bile duct  . Normal pregnancy, first 09/09/2012  . SVD (spontaneous vaginal delivery) 09/11/2012    Past Surgical History  Procedure Laterality Date  . Sphincterectomy    . Wisdom tooth extraction    . Ercp  2009  . Carpal tunnel release Left 10/04/2012    Procedure: CARPAL TUNNEL RELEASE;  Surgeon: Kevin R Kuzma, MD;  Location: [facility];  Service: Orthopedics;  Laterality: Left;    Family History  Problem Relation Age of Onset  . Hypertension Mother   . COPD Maternal Grandmother   . Hyperlipidemia Father    Social History:  reports that she has never smoked. She does not have any smokeless tobacco history on file. She reports that she does not drink alcohol or use illicit drugs.  Allergies: No Known Allergies   (Not in a hospital admission)  No results found for this or any previous visit (from the past 48 hour(s)). No results found.  Review of Systems  Constitutional: Negative.   Musculoskeletal: Positive for back pain (at site of skin lesion).    Blood pressure 112/72, pulse 80, temperature 98.8 F (37.1 C), resp. rate 18, height 5' 6" (1.676 m), weight 181 lb (82.101 kg). Physical Exam  Constitutional: She is oriented to person, place, and time. She appears well-developed and well-nourished.  HENT:    Head: Normocephalic and atraumatic.  Eyes: Pupils are equal, round, and reactive to light.  Neck: Normal range of motion. Neck supple.  Cardiovascular: Normal rate and regular rhythm.   Respiratory: Effort normal.    GI: Soft. Bowel sounds are normal.  Musculoskeletal: Normal range of motion.  Neurological: She is alert and oriented to person, place, and time.  Skin: Skin is warm and dry.  Psychiatric: Her behavior is normal. Judgment and thought content normal.     Assessment/Plan Symptomatic left upper back lesion characterized as a dermatofibroma from prior biopsy.  I would recommend removing this completely because of a mild risk of cancer and also because of current symptoms. The patient does have a family history of skin cancers. This has been increasingly more friable but otherwise has not changed colors very much.  Laurie [name] 12/11/2012, 9:24 AM    

## 2012-12-18 NOTE — Anesthesia Preprocedure Evaluation (Signed)
Anesthesia Evaluation  Patient identified by MRN, date of birth, ID band Patient awake    Reviewed: Allergy & Precautions, H&P , NPO status , Patient's Chart, lab work & pertinent test results  Airway Mallampati: I TM Distance: >3 FB Neck ROM: Full    Dental  (+) Teeth Intact and Dental Advisory Given   Pulmonary  breath sounds clear to auscultation        Cardiovascular Rhythm:Regular Rate:Normal     Neuro/Psych    GI/Hepatic   Endo/Other    Renal/GU      Musculoskeletal   Abdominal   Peds  Hematology   Anesthesia Other Findings   Reproductive/Obstetrics                           Anesthesia Physical Anesthesia Plan  ASA: I  Anesthesia Plan: MAC   Post-op Pain Management:    Induction: Intravenous  Airway Management Planned: Simple Face Mask  Additional Equipment:   Intra-op Plan:   Post-operative Plan: Extubation in OR  Informed Consent: I have reviewed the patients History and Physical, chart, labs and discussed the procedure including the risks, benefits and alternatives for the proposed anesthesia with the patient or authorized representative who has indicated his/her understanding and acceptance.   Dental advisory given  Plan Discussed with: Anesthesiologist, Surgeon and CRNA  Anesthesia Plan Comments:         Anesthesia Quick Evaluation

## 2012-12-19 ENCOUNTER — Telehealth: Payer: Self-pay | Admitting: *Deleted

## 2012-12-19 ENCOUNTER — Telehealth (INDEPENDENT_AMBULATORY_CARE_PROVIDER_SITE_OTHER): Payer: Self-pay

## 2012-12-19 NOTE — Telephone Encounter (Signed)
Message copied by Brennan Bailey on Wed Dec 19, 2012  4:23 PM ------      Message from: Louie Casa      Created: Wed Dec 19, 2012  1:40 PM      Regarding: Dr. Lindie Spruce Questions      Contact: [phone]       Patient had Exc Lt Shoulder surgery yesterday 12/18/12 with       Dr. Lindie Spruce and she has some questions about her bandages, please call her.            Thank you.       ------

## 2012-12-19 NOTE — Telephone Encounter (Signed)
Pt's orthotics are in, pt needs an appt to pick them up with Dr Charlsie Merles.

## 2012-12-19 NOTE — Telephone Encounter (Signed)
SCHED PT APPT TO [facility]

## 2012-12-19 NOTE — Telephone Encounter (Signed)
Returned pt call. She asked if her dressing can come of since her po appt was moved back. Advises she could take her bulky abd pad off and get in shower. tegaderm will come off in few days. Dermabond will flake off over next 2 weeks. If any drainage occurs cover with guaze. She will call for sooner appt if she thinks wound needs to be looked at before then.

## 2012-12-20 ENCOUNTER — Encounter (HOSPITAL_BASED_OUTPATIENT_CLINIC_OR_DEPARTMENT_OTHER): Payer: Self-pay | Admitting: General Surgery

## 2012-12-24 ENCOUNTER — Encounter (HOSPITAL_BASED_OUTPATIENT_CLINIC_OR_DEPARTMENT_OTHER): Payer: Self-pay

## 2012-12-24 ENCOUNTER — Ambulatory Visit (HOSPITAL_BASED_OUTPATIENT_CLINIC_OR_DEPARTMENT_OTHER): Payer: BC Managed Care – PPO | Admitting: Anesthesiology

## 2012-12-24 ENCOUNTER — Ambulatory Visit (HOSPITAL_BASED_OUTPATIENT_CLINIC_OR_DEPARTMENT_OTHER)
Admission: RE | Admit: 2012-12-24 | Discharge: 2012-12-24 | Disposition: A | Discharge status: Home / Self Care | Payer: BC Managed Care – PPO | Source: Ambulatory Visit | Attending: Orthopedic Surgery | Admitting: Orthopedic Surgery

## 2012-12-24 ENCOUNTER — Encounter (HOSPITAL_BASED_OUTPATIENT_CLINIC_OR_DEPARTMENT_OTHER)
Admission: RE | Disposition: A | Discharge status: Home / Self Care | Payer: Self-pay | Source: Ambulatory Visit | Attending: Orthopedic Surgery

## 2012-12-24 ENCOUNTER — Encounter (HOSPITAL_BASED_OUTPATIENT_CLINIC_OR_DEPARTMENT_OTHER): Payer: BC Managed Care – PPO | Admitting: Anesthesiology

## 2012-12-24 DIAGNOSIS — I499 Cardiac arrhythmia, unspecified: Secondary | ICD-10-CM | POA: Insufficient documentation

## 2012-12-24 DIAGNOSIS — G5600 Carpal tunnel syndrome, unspecified upper limb: Secondary | ICD-10-CM | POA: Insufficient documentation

## 2012-12-24 HISTORY — PX: CARPAL TUNNEL RELEASE: SHX101

## 2012-12-24 SURGERY — CARPAL TUNNEL RELEASE
Anesthesia: Monitor Anesthesia Care | Site: Hand | Laterality: Right

## 2012-12-24 MED ORDER — MIDAZOLAM HCL 2 MG/2ML IJ SOLN: 1.0000 mg | INTRAMUSCULAR | Status: DC | PRN

## 2012-12-24 MED ORDER — PROMETHAZINE HCL 25 MG/ML IJ SOLN: 6.2500 mg | INTRAMUSCULAR | Status: DC | PRN

## 2012-12-24 MED ORDER — TRAMADOL HCL 50 MG PO TABS: 50.0000 mg | ORAL_TABLET | Freq: Four times a day (QID) | ORAL | Status: DC | PRN

## 2012-12-24 MED ORDER — HYDROMORPHONE HCL PF 1 MG/ML IJ SOLN: 0.2500 mg | INTRAMUSCULAR | Status: DC | PRN

## 2012-12-24 MED ORDER — LIDOCAINE HCL (CARDIAC) 20 MG/ML IV SOLN
INTRAVENOUS | Status: DC | PRN
  Administered 2012-12-24: 50 mg via INTRAVENOUS

## 2012-12-24 MED ORDER — OXYCODONE HCL 5 MG PO TABS
5.0000 mg | ORAL_TABLET | Freq: Once | ORAL | Status: AC | PRN
  Administered 2012-12-24: 5 mg via ORAL

## 2012-12-24 MED ORDER — ONDANSETRON HCL 4 MG/2ML IJ SOLN
INTRAMUSCULAR | Status: DC | PRN
  Administered 2012-12-24: 4 mg via INTRAVENOUS

## 2012-12-24 MED ORDER — BUPIVACAINE HCL (PF) 0.25 % IJ SOLN
INTRAMUSCULAR | Status: AC
  Filled 2012-12-24: qty 30

## 2012-12-24 MED ORDER — CHLORHEXIDINE GLUCONATE 4 % EX LIQD: 60.0000 mL | Freq: Once | CUTANEOUS | Status: DC

## 2012-12-24 MED ORDER — FENTANYL CITRATE 0.05 MG/ML IJ SOLN
INTRAMUSCULAR | Status: DC | PRN
  Administered 2012-12-24: 100 ug via INTRAVENOUS

## 2012-12-24 MED ORDER — CEFAZOLIN SODIUM-DEXTROSE 2-3 GM-% IV SOLR
INTRAVENOUS | Status: AC
  Filled 2012-12-24: qty 50

## 2012-12-24 MED ORDER — BUPIVACAINE HCL (PF) 0.25 % IJ SOLN
INTRAMUSCULAR | Status: DC | PRN
  Administered 2012-12-24: 9 mL

## 2012-12-24 MED ORDER — FENTANYL CITRATE 0.05 MG/ML IJ SOLN: 50.0000 ug | INTRAMUSCULAR | Status: DC | PRN

## 2012-12-24 MED ORDER — MIDAZOLAM HCL 2 MG/2ML IJ SOLN
INTRAMUSCULAR | Status: AC
  Filled 2012-12-24: qty 2

## 2012-12-24 MED ORDER — PROPOFOL INFUSION 10 MG/ML OPTIME
INTRAVENOUS | Status: DC | PRN
  Administered 2012-12-24: 100 ug/kg/min via INTRAVENOUS

## 2012-12-24 MED ORDER — OXYCODONE HCL 5 MG PO TABS
ORAL_TABLET | ORAL | Status: AC
  Filled 2012-12-24: qty 1

## 2012-12-24 MED ORDER — LACTATED RINGERS IV SOLN
INTRAVENOUS | Status: DC
Start: 2012-12-24 — End: 2012-12-24
  Administered 2012-12-24: 09:00:00 via INTRAVENOUS

## 2012-12-24 MED ORDER — OXYCODONE HCL 5 MG/5ML PO SOLN: 5.0000 mg | Freq: Once | ORAL | Status: AC | PRN

## 2012-12-24 MED ORDER — LACTATED RINGERS IV SOLN
INTRAVENOUS | Status: DC | PRN
  Administered 2012-12-24: 10:00:00 via INTRAVENOUS

## 2012-12-24 MED ORDER — MIDAZOLAM HCL 5 MG/5ML IJ SOLN
INTRAMUSCULAR | Status: DC | PRN
  Administered 2012-12-24: 2 mg via INTRAVENOUS

## 2012-12-24 MED ORDER — CEFAZOLIN SODIUM-DEXTROSE 2-3 GM-% IV SOLR
2.0000 g | INTRAVENOUS | Status: AC
  Administered 2012-12-24: 2 g via INTRAVENOUS

## 2012-12-24 MED ORDER — FENTANYL CITRATE 0.05 MG/ML IJ SOLN
INTRAMUSCULAR | Status: AC
  Filled 2012-12-24: qty 2

## 2012-12-24 MED ORDER — FENTANYL CITRATE 0.05 MG/ML IJ SOLN: 50.0000 ug | Freq: Once | INTRAMUSCULAR | Status: DC

## 2012-12-24 SURGICAL SUPPLY — 32 items
BANDAGE ELASTIC 3 VELCRO ST LF (GAUZE/BANDAGES/DRESSINGS) ×2 IMPLANT
BLADE MINI RND TIP GREEN BEAV (BLADE) IMPLANT
BLADE SURG 15 STRL LF DISP TIS (BLADE) ×2 IMPLANT
BLADE SURG 15 STRL SS (BLADE) ×2
BNDG ESMARK 4X9 LF (GAUZE/BANDAGES/DRESSINGS) IMPLANT
BNDG GAUZE ELAST 4 BULKY (GAUZE/BANDAGES/DRESSINGS) ×2 IMPLANT
CHLORAPREP W/TINT 26ML (MISCELLANEOUS) ×2 IMPLANT
CORDS BIPOLAR (ELECTRODE) ×2 IMPLANT
COVER MAYO STAND STRL (DRAPES) ×2 IMPLANT
COVER TABLE BACK 60X90 (DRAPES) ×2 IMPLANT
CUFF TOURNIQUET SINGLE 18IN (TOURNIQUET CUFF) ×2 IMPLANT
DRAPE EXTREMITY T 121X128X90 (DRAPE) ×2 IMPLANT
DRAPE SURG 17X23 STRL (DRAPES) ×2 IMPLANT
GAUZE XEROFORM 1X8 LF (GAUZE/BANDAGES/DRESSINGS) ×2 IMPLANT
GLOVE BIO SURGEON STRL SZ7.5 (GLOVE) ×2 IMPLANT
GLOVE BIOGEL PI IND STRL 8 (GLOVE) ×1 IMPLANT
GLOVE BIOGEL PI INDICATOR 8 (GLOVE) ×1
GOWN BRE IMP PREV XXLGXLNG (GOWN DISPOSABLE) ×2 IMPLANT
GOWN PREVENTION PLUS XLARGE (GOWN DISPOSABLE) ×2 IMPLANT
NEEDLE HYPO 25X1 1.5 SAFETY (NEEDLE) IMPLANT
NS IRRIG 1000ML POUR BTL (IV SOLUTION) ×2 IMPLANT
PACK BASIN DAY SURGERY FS (CUSTOM PROCEDURE TRAY) ×2 IMPLANT
PAD ABD 8X10 STRL (GAUZE/BANDAGES/DRESSINGS) ×2 IMPLANT
PADDING CAST ABS 4INX4YD NS (CAST SUPPLIES)
PADDING CAST ABS COTTON 4X4 ST (CAST SUPPLIES) IMPLANT
SPONGE GAUZE 4X4 12PLY (GAUZE/BANDAGES/DRESSINGS) ×2 IMPLANT
STOCKINETTE 4X48 STRL (DRAPES) ×2 IMPLANT
SUT ETHILON 4 0 PS 2 18 (SUTURE) ×2 IMPLANT
SYR BULB 3OZ (MISCELLANEOUS) ×2 IMPLANT
SYR CONTROL 10ML LL (SYRINGE) IMPLANT
TOWEL OR 17X24 6PK STRL BLUE (TOWEL DISPOSABLE) ×2 IMPLANT
UNDERPAD 30X30 INCONTINENT (UNDERPADS AND DIAPERS) ×2 IMPLANT

## 2012-12-24 NOTE — Anesthesia Postprocedure Evaluation (Signed)
  Anesthesia Post-op Note  Patient: Laurie [name]  Procedure(s) Performed: Procedure(s): RIGHT CARPAL TUNNEL RELEASE (Right)  Patient Location: PACU  Anesthesia Type:MAC and Bier block  Level of Consciousness: awake and alert   Airway and Oxygen Therapy: Patient Spontanous Breathing  Post-op Pain: mild  Post-op Assessment: Post-op Vital signs reviewed, Patient's Cardiovascular Status Stable, Respiratory Function Stable, Patent Airway, No signs of Nausea or vomiting and Pain level controlled  Post-op Vital Signs: Reviewed and stable  Complications: No apparent anesthesia complications

## 2012-12-24 NOTE — Anesthesia Preprocedure Evaluation (Signed)
Anesthesia Evaluation  Patient identified by MRN, date of birth, ID band Patient awake    Reviewed: Allergy & Precautions, H&P , NPO status , Patient's Chart, lab work & pertinent test results  Airway Mallampati: I TM Distance: >3 FB Neck ROM: Full    Dental   Pulmonary          Cardiovascular + dysrhythmias Rhythm:Regular Rate:Normal     Neuro/Psych    GI/Hepatic   Endo/Other    Renal/GU      Musculoskeletal   Abdominal (+) + obese,   Peds  (+) ATTENTION DEFICIT DISORDER WITHOUT HYPERACTIVITY Hematology   Anesthesia Other Findings   Reproductive/Obstetrics                           Anesthesia Physical Anesthesia Plan  ASA: II  Anesthesia Plan: MAC and Bier Block   Post-op Pain Management:    Induction: Intravenous  Airway Management Planned: Mask  Additional Equipment:   Intra-op Plan:   Post-operative Plan:   Informed Consent: I have reviewed the patients History and Physical, chart, labs and discussed the procedure including the risks, benefits and alternatives for the proposed anesthesia with the patient or authorized representative who has indicated his/her understanding and acceptance.     Plan Discussed with: CRNA and Surgeon  Anesthesia Plan Comments:         Anesthesia Quick Evaluation

## 2012-12-24 NOTE — Brief Op Note (Signed)
12/24/2012  10:21 AM  PATIENT:  Laurie [name]  32 y.o. female  PRE-OPERATIVE DIAGNOSIS:  RIGHT  CARPAL TUNNEL SYNDROME  POST-OPERATIVE DIAGNOSIS:  RIGHT  CARPAL TUNNEL SYNDROME  PROCEDURE:  Procedure(s): RIGHT CARPAL TUNNEL RELEASE (Right)  SURGEON:  Surgeon(s) and Role:    * Tami Ribas, MD - Primary  PHYSICIAN ASSISTANT:   ASSISTANTS: none   ANESTHESIA:   Bier block  EBL:     BLOOD ADMINISTERED:none  DRAINS: none   LOCAL MEDICATIONS USED:  MARCAINE     SPECIMEN:  No Specimen  DISPOSITION OF SPECIMEN:  N/A  COUNTS:  YES  TOURNIQUET:   Total Tourniquet Time Documented: Forearm (Right) - 25 minutes Total: Forearm (Right) - 25 minutes   DICTATION: .Other Dictation: Dictation Number [id number]  PLAN OF CARE: Discharge to home after PACU  PATIENT DISPOSITION:  PACU - hemodynamically stable.

## 2012-12-24 NOTE — Transfer of Care (Signed)
Immediate Anesthesia Transfer of Care Note  Patient: Laurie [name]  Procedure(s) Performed: Procedure(s): RIGHT CARPAL TUNNEL RELEASE (Right)  Patient Location: PACU  Anesthesia Type:Bier block  Level of Consciousness: awake, alert  and oriented  Airway & Oxygen Therapy: Patient Spontanous Breathing and Patient connected to face mask oxygen  Post-op Assessment: Report given to PACU RN and Post -op Vital signs reviewed and stable  Post vital signs: Reviewed and stable  Complications: No apparent anesthesia complications

## 2012-12-24 NOTE — Op Note (Signed)
[id number] 

## 2012-12-24 NOTE — H&P (Signed)
  Laurie [name] is an 32 y.o. female.   Chief Complaint: right carpal tunnel syndrome HPI: 32 yo rhd female with pins and needles sensation right hand x 7 years.  Nocturnal symptoms that wake her.  Positive nerve conduction studies.  Has had previous left carpal tunnel release and wishes to have right carpal tunnel release.  Past Medical History  Diagnosis Date  . Pancreatitis   . Dysrhythmia     PVCs  . Acne     accutane in college  . ADD (attention deficit disorder)   . Hx of pancreatitis   . Congenital defect     bile duct  . Normal pregnancy, first 09/09/2012  . SVD (spontaneous vaginal delivery) 09/11/2012    Past Surgical History  Procedure Laterality Date  . Sphincterectomy    . Wisdom tooth extraction    . Ercp  2009  . Carpal tunnel release Left 10/04/2012    Procedure: CARPAL TUNNEL RELEASE;  Surgeon: Tami Ribas, MD;  Location: [facility];  Service: Orthopedics;  Laterality: Left;  Marland Kitchen Mass excision Left 12/18/2012    Procedure: EXCISION OF LEFT POSTERIOR  SHOULDER DERMATOFIBROMA;  Surgeon: Cherylynn Ridges, MD;  Location: [facility];  Service: General;  Laterality: Left;    Family History  Problem Relation Age of Onset  . Hypertension Mother   . COPD Maternal Grandmother   . Hyperlipidemia Father    Social History:  reports that she has never smoked. She does not have any smokeless tobacco history on file. She reports that she does not drink alcohol or use illicit drugs.  Allergies: No Known Allergies  Medications Prior to Admission  Medication Sig Dispense Refill  . diclofenac (VOLTAREN) 75 MG EC tablet Take 1 tablet (75 mg total) by mouth 2 (two) times daily.  40 tablet  1  . etonogestrel-ethinyl estradiol (NUVARING) 0.12-0.015 MG/24HR vaginal ring Place 1 each vaginally every 28 (twenty-eight) days. Insert vaginally and leave in place for 3 consecutive weeks, then remove for 1 week.      . Prenatal Vit-Fe Fumarate-FA (PRENATAL  MULTIVITAMIN) TABS tablet Take 1 tablet by mouth at bedtime.  30 tablet  12  . HYDROcodone-acetaminophen (NORCO) 5-325 MG per tablet Take 1 tablet by mouth every 6 (six) hours as needed for moderate pain.  30 tablet  0    No results found for this or any previous visit (from the past 48 hour(s)).  No results found.   A comprehensive review of systems was negative.  Blood pressure 133/90, pulse 75, temperature 98.3 F (36.8 C), temperature source Oral, resp. rate 20, height 5\' 6"  (1.676 m), weight 182 lb (82.555 kg), last menstrual period 12/21/2012, SpO2 98.00%.  General appearance: alert, cooperative and appears stated age Head: Normocephalic, without obvious abnormality, atraumatic Neck: supple, symmetrical, trachea midline Resp: clear to auscultation bilaterally Cardio: regular rate and rhythm GI: non tender Extremities: intact sensation and capillary refill all digits.  +epl/fpl/io.  no wounds. Pulses: 2+ and symmetric Skin: Skin color, texture, turgor normal. No rashes or lesions Neurologic: Grossly normal Incision/Wound: none  Assessment/Plan Right carpal tunnel syndrome.  Non operative and operative treatment options were discussed with the Laurie [name] and Laurie [name] wishes to proceed with operative treatment. Risks, benefits, and alternatives of surgery were discussed and the Laurie [name] agrees with the plan of care.   Tyress Loden R 12/24/2012, 9:34 AM

## 2012-12-25 ENCOUNTER — Encounter (HOSPITAL_BASED_OUTPATIENT_CLINIC_OR_DEPARTMENT_OTHER): Payer: Self-pay | Admitting: Orthopedic Surgery

## 2012-12-25 NOTE — Op Note (Signed)
Laurie [name], Laurie [name] NO.:  [id number]  MEDICAL RECORD NO.:  [id number]  LOCATION:                                 FACILITY:  PHYSICIAN:  Betha Loa, MD        DATE OF BIRTH:  [birth year]  DATE OF PROCEDURE:  12/24/2012 DATE OF DISCHARGE:                              OPERATIVE REPORT   PREOPERATIVE DIAGNOSIS:  Right carpal tunnel syndrome.  POSTOPERATIVE DIAGNOSIS:  Right carpal tunnel syndrome.  PROCEDURE:  Right carpal tunnel release.  SURGEON:  Betha Loa, MD  ASSISTANTS:  None.  ANESTHESIA:  Bier block with sedation.  IV FLUIDS:  Per anesthesia flow sheet.  ESTIMATED BLOOD LOSS:  Minimal.  COMPLICATIONS:  None.  SPECIMENS:  None.  TOURNIQUET TIME:  25 minutes.  DISPOSITION:  Stable to PACU.  INDICATIONS:  Laurie [name] is a 32 year old, right-hand dominant female who has had carpal tunnel syndrome in bilateral hands for approximately 7 years.  She has had a previous left carpal tunnel release and has been happy with results.  She wished to have a right carpal tunnel release for management of symptoms.  Risks, benefits, and alternatives of surgery were discussed including risk of blood loss, infection, damage to nerves, vessels, tendons, ligaments, bone, failure of surgery, need for additional surgery, complications with wound healing, continued pain, continued carpal tunnel syndrome.  She voiced understanding of these risks and elected to proceed.  OPERATIVE COURSE:  After being identified preoperatively by myself, the patient and I agreed upon procedure and site of procedure.  Surgical site was marked.  Risks, benefits, and alternatives of surgery were reviewed and she wished to proceed.  Surgical consent had been signed. She was given IV Ancef as preoperative antibiotic prophylaxis.  She was transported to the operating room and placed on the operating room table in supine position with the right upper extremity on arm board.  Bier block  anesthesia was induced by anesthesiologist.  The right upper extremity was prepped and draped in normal sterile orthopedic fashion. A surgical pause was performed between surgeons, anesthesia, and operating room staff, and all were in agreement as to the patient, procedure, and site of procedure.  Tourniquet at the proximal aspect of the forearm had been inflated for the Bier block.  An incision was made over the transverse carpal ligament.  This was carried into subcutaneous tissues by spreading technique.  Bipolar electrocautery was used to obtain hemostasis.  The transverse carpal ligament was incised sharply with the knife.  Care was taken to ensure complete decompression distally.  It was then incised proximally.  The scissors were used to split the distal aspect of the volar antebrachial fascia.  The finger was placed into the wound to ensure complete decompression which was the case.  The nerve was inspected.  It was hyperemic and flattened.  The motor branch was identified and was intact.  The wound was copiously irrigated with sterile saline.  It was closed with 4-0 nylon in horizontal mattress fashion.  It was injected with 9 mL of 0.25% plain Marcaine to aid in postoperative analgesia.  It was then dressed with sterile Xeroform, 4x4s, and ABD and wrapped with Kerlix and  Ace bandage. Tourniquet was deflated at 25 minutes.  Fingertips were pink with brisk capillary refill after deflation of the tourniquet.  The operative drapes were broken down, and the patient was awoken from anesthesia safely.  She was transferred back to the stretcher and taken to PACU in stable condition.  I will see her back in the office in 1 week for postoperative followup.  She was given Ultram 50 mg 1-2 p.o. q.6 hours p.r.n. pain, dispensed #30.     Betha Loa, MD     KK/MEDQ  D:  12/24/2012  T:  12/24/2012  Job:  [id number]

## 2012-12-26 ENCOUNTER — Encounter: Payer: Self-pay | Admitting: Podiatry

## 2012-12-26 ENCOUNTER — Ambulatory Visit (INDEPENDENT_AMBULATORY_CARE_PROVIDER_SITE_OTHER): Payer: BC Managed Care – PPO | Admitting: Podiatry

## 2012-12-26 VITALS — BP 118/78 | HR 90 | Resp 16 | Ht 66.0 in | Wt 181.0 lb

## 2012-12-26 DIAGNOSIS — M722 Plantar fascial fibromatosis: Secondary | ICD-10-CM

## 2012-12-26 NOTE — Progress Notes (Signed)
Subjective:     Patient ID: Laurie [name], female   DOB: 06-30-1980, 32 y.o.   MRN: [id number]  HPI patient presents stating my heel is feeling much better on my right foot. I have been doing my physical therapy and using my splint   Review of Systems     Objective:   Physical Exam Neurovascular status intact with no health history changes noted and significant diminishment of discomfort right plantar heel    Assessment:     Plantar fasciitis improving right    Plan:     Discussed continued orthotic therapy and at this time dispensed orthotics to this patient. Reviewed continued physical therapy and utilization of night splint. Reappoint 6 weeks

## 2012-12-26 NOTE — Patient Instructions (Signed)

## 2013-01-08 ENCOUNTER — Encounter (INDEPENDENT_AMBULATORY_CARE_PROVIDER_SITE_OTHER): Payer: BC Managed Care – PPO | Admitting: General Surgery

## 2013-01-22 ENCOUNTER — Encounter (INDEPENDENT_AMBULATORY_CARE_PROVIDER_SITE_OTHER): Payer: BC Managed Care – PPO | Admitting: General Surgery

## 2013-08-15 ENCOUNTER — Ambulatory Visit (INDEPENDENT_AMBULATORY_CARE_PROVIDER_SITE_OTHER): Payer: BC Managed Care – PPO | Admitting: Podiatry

## 2013-08-15 ENCOUNTER — Encounter: Payer: Self-pay | Admitting: Podiatry

## 2013-08-15 VITALS — BP 118/69 | HR 83 | Resp 16

## 2013-08-15 DIAGNOSIS — M722 Plantar fascial fibromatosis: Secondary | ICD-10-CM

## 2013-08-15 MED ORDER — TRIAMCINOLONE ACETONIDE 10 MG/ML IJ SUSP
10.0000 mg | Freq: Once | INTRAMUSCULAR | Status: AC
  Administered 2013-08-15: 10 mg

## 2013-08-15 NOTE — Patient Instructions (Signed)

## 2013-08-16 NOTE — Progress Notes (Signed)
Subjective:     Patient ID: Laurie [name], female   DOB: 08-29-1980, 33 y.o.   MRN: [id number]  HPI patient presents stating that this heel has absolutely been driving me crazy right and was only good for 2 weeks after the last injection and that it started up but I simply have not been able to get back due to my schedule    Review of Systems     Objective:   Physical Exam Neurovascular status intact with exquisite discomfort plantar aspect right heel at the insertion of the tendon into the calcaneus    Assessment:     Severe plantar fasciitis of the right heel    Plan:     Reviewed condition and at this point due to the long-term nature of this we're going to try an aggressive conservative approach with consideration long-term for endoscopic release of the fascially band. Patient understands this and wants to follow this approach and today we reinjected the plantar fascia 3 mg Kenalog 5 mg like Marcaine mixture and I dispensed an air fracture walker to use as much as possible and will see back in 4 weeks and decide what might be necessary long-term including the possibility for surgery reappoint her recheck

## 2013-11-04 ENCOUNTER — Encounter: Payer: Self-pay | Admitting: Podiatry

## 2014-01-08 ENCOUNTER — Telehealth: Payer: Self-pay | Admitting: *Deleted

## 2014-01-08 NOTE — Telephone Encounter (Signed)
Pt states she feels she has exhausted all effort to cure the tendonitis in her foot and she would like to discuss EPAT with Dr. Mellody Drown assistant.

## 2014-01-08 NOTE — Telephone Encounter (Signed)
Spoke with patient and discussed EPAT. Said she was interested before having surgery. She will talk to her husband and call us back to schedule.

## 2014-02-03 DIAGNOSIS — M6701 Short Achilles tendon (acquired), right ankle: Secondary | ICD-10-CM

## 2014-02-03 DIAGNOSIS — M722 Plantar fascial fibromatosis: Secondary | ICD-10-CM

## 2014-02-03 HISTORY — DX: Short Achilles tendon (acquired), right ankle: M67.01

## 2014-02-03 HISTORY — DX: Plantar fascial fibromatosis: M72.2

## 2014-02-07 ENCOUNTER — Encounter (HOSPITAL_BASED_OUTPATIENT_CLINIC_OR_DEPARTMENT_OTHER): Payer: Self-pay | Admitting: *Deleted

## 2014-02-13 ENCOUNTER — Ambulatory Visit (HOSPITAL_BASED_OUTPATIENT_CLINIC_OR_DEPARTMENT_OTHER): Payer: No Typology Code available for payment source | Admitting: Anesthesiology

## 2014-02-13 ENCOUNTER — Encounter (HOSPITAL_BASED_OUTPATIENT_CLINIC_OR_DEPARTMENT_OTHER): Payer: Self-pay | Admitting: *Deleted

## 2014-02-13 ENCOUNTER — Ambulatory Visit (HOSPITAL_BASED_OUTPATIENT_CLINIC_OR_DEPARTMENT_OTHER)
Admission: RE | Admit: 2014-02-13 | Discharge: 2014-02-13 | Disposition: A | Discharge status: Home / Self Care | Payer: No Typology Code available for payment source | Source: Ambulatory Visit | Attending: Orthopedic Surgery | Admitting: Orthopedic Surgery

## 2014-02-13 ENCOUNTER — Encounter (HOSPITAL_BASED_OUTPATIENT_CLINIC_OR_DEPARTMENT_OTHER)
Admission: RE | Disposition: A | Discharge status: Home / Self Care | Payer: Self-pay | Source: Ambulatory Visit | Attending: Orthopedic Surgery

## 2014-02-13 DIAGNOSIS — M6701 Short Achilles tendon (acquired), right ankle: Secondary | ICD-10-CM | POA: Diagnosis not present

## 2014-02-13 DIAGNOSIS — Z79899 Other long term (current) drug therapy: Secondary | ICD-10-CM | POA: Diagnosis not present

## 2014-02-13 DIAGNOSIS — M722 Plantar fascial fibromatosis: Secondary | ICD-10-CM | POA: Insufficient documentation

## 2014-02-13 DIAGNOSIS — M79671 Pain in right foot: Secondary | ICD-10-CM | POA: Diagnosis present

## 2014-02-13 HISTORY — DX: Plantar fascial fibromatosis: M72.2

## 2014-02-13 HISTORY — DX: Family history of other specified conditions: Z84.89

## 2014-02-13 HISTORY — PX: GASTROC RECESSION EXTREMITY: SHX6262

## 2014-02-13 HISTORY — DX: Short Achilles tendon (acquired), right ankle: M67.01

## 2014-02-13 HISTORY — PX: PLANTAR FASCIA RELEASE: SHX2239

## 2014-02-13 LAB — POCT HEMOGLOBIN-HEMACUE: Hemoglobin: 14.5 g/dL (ref 12.0–15.0)

## 2014-02-13 LAB — HCG, QUANTITATIVE, PREGNANCY

## 2014-02-13 SURGERY — RELEASE, FASCIA, PLANTAR
Anesthesia: General | Site: Leg Lower | Laterality: Right

## 2014-02-13 MED ORDER — MIDAZOLAM HCL 2 MG/2ML IJ SOLN
1.0000 mg | INTRAMUSCULAR | Status: DC | PRN
  Administered 2014-02-13 (×2): 2 mg via INTRAVENOUS

## 2014-02-13 MED ORDER — ONDANSETRON HCL 4 MG/2ML IJ SOLN: 4.0000 mg | Freq: Once | INTRAMUSCULAR | Status: DC | PRN

## 2014-02-13 MED ORDER — BUPIVACAINE-EPINEPHRINE 0.5% -1:200000 IJ SOLN
INTRAMUSCULAR | Status: DC | PRN
  Administered 2014-02-13: 10 mL

## 2014-02-13 MED ORDER — FENTANYL CITRATE 0.05 MG/ML IJ SOLN
50.0000 ug | INTRAMUSCULAR | Status: DC | PRN
  Administered 2014-02-13 (×2): 100 ug via INTRAVENOUS

## 2014-02-13 MED ORDER — MIDAZOLAM HCL 2 MG/ML PO SYRP: 12.0000 mg | ORAL_SOLUTION | Freq: Once | ORAL | Status: DC | PRN

## 2014-02-13 MED ORDER — SODIUM CHLORIDE 0.9 % IV SOLN: INTRAVENOUS | Status: DC

## 2014-02-13 MED ORDER — FENTANYL CITRATE 0.05 MG/ML IJ SOLN
INTRAMUSCULAR | Status: AC
  Filled 2014-02-13: qty 2

## 2014-02-13 MED ORDER — OXYCODONE HCL 5 MG/5ML PO SOLN: 5.0000 mg | Freq: Once | ORAL | Status: DC | PRN

## 2014-02-13 MED ORDER — BACITRACIN ZINC 500 UNIT/GM EX OINT
TOPICAL_OINTMENT | CUTANEOUS | Status: DC | PRN
  Administered 2014-02-13: 1 via TOPICAL

## 2014-02-13 MED ORDER — OXYCODONE HCL 5 MG PO TABS: 5.0000 mg | ORAL_TABLET | ORAL | Status: DC | PRN

## 2014-02-13 MED ORDER — CEFAZOLIN SODIUM-DEXTROSE 2-3 GM-% IV SOLR
2.0000 g | INTRAVENOUS | Status: AC
  Administered 2014-02-13: 2 g via INTRAVENOUS

## 2014-02-13 MED ORDER — MIDAZOLAM HCL 2 MG/2ML IJ SOLN
INTRAMUSCULAR | Status: AC
  Filled 2014-02-13: qty 2

## 2014-02-13 MED ORDER — LIDOCAINE HCL (CARDIAC) 20 MG/ML IV SOLN
INTRAVENOUS | Status: DC | PRN
  Administered 2014-02-13: 80 mg via INTRAVENOUS

## 2014-02-13 MED ORDER — ROPIVACAINE HCL 5 MG/ML IJ SOLN
INTRAMUSCULAR | Status: DC | PRN
  Administered 2014-02-13: 20 mL via PERINEURAL

## 2014-02-13 MED ORDER — DEXAMETHASONE SODIUM PHOSPHATE 4 MG/ML IJ SOLN
INTRAMUSCULAR | Status: DC | PRN
  Administered 2014-02-13: 10 mg via INTRAVENOUS

## 2014-02-13 MED ORDER — 0.9 % SODIUM CHLORIDE (POUR BTL) OPTIME
TOPICAL | Status: DC | PRN
  Administered 2014-02-13: 240 mL

## 2014-02-13 MED ORDER — PROPOFOL 10 MG/ML IV BOLUS
INTRAVENOUS | Status: DC | PRN
  Administered 2014-02-13: 200 mg via INTRAVENOUS

## 2014-02-13 MED ORDER — HYDROMORPHONE HCL 1 MG/ML IJ SOLN: 0.2500 mg | INTRAMUSCULAR | Status: DC | PRN

## 2014-02-13 MED ORDER — LACTATED RINGERS IV SOLN
INTRAVENOUS | Status: DC
Start: 2014-02-13 — End: 2014-02-13
  Administered 2014-02-13 (×2): via INTRAVENOUS

## 2014-02-13 MED ORDER — FENTANYL CITRATE 0.05 MG/ML IJ SOLN
INTRAMUSCULAR | Status: AC
  Filled 2014-02-13: qty 4

## 2014-02-13 MED ORDER — BACITRACIN ZINC 500 UNIT/GM EX OINT
TOPICAL_OINTMENT | CUTANEOUS | Status: AC
  Filled 2014-02-13: qty 28.35

## 2014-02-13 MED ORDER — BUPIVACAINE-EPINEPHRINE (PF) 0.5% -1:200000 IJ SOLN
INTRAMUSCULAR | Status: DC | PRN
  Administered 2014-02-13: 20 mL via PERINEURAL

## 2014-02-13 MED ORDER — CEFAZOLIN SODIUM-DEXTROSE 2-3 GM-% IV SOLR
INTRAVENOUS | Status: AC
  Filled 2014-02-13: qty 50

## 2014-02-13 MED ORDER — FENTANYL CITRATE 0.05 MG/ML IJ SOLN
INTRAMUSCULAR | Status: DC | PRN
  Administered 2014-02-13: 25 ug via INTRAVENOUS

## 2014-02-13 MED ORDER — CHLORHEXIDINE GLUCONATE 4 % EX LIQD: 60.0000 mL | Freq: Once | CUTANEOUS | Status: DC

## 2014-02-13 MED ORDER — OXYCODONE HCL 5 MG PO TABS: 5.0000 mg | ORAL_TABLET | Freq: Once | ORAL | Status: DC | PRN

## 2014-02-13 SURGICAL SUPPLY — 55 items
BANDAGE ESMARK 6X9 LF (GAUZE/BANDAGES/DRESSINGS) ×2 IMPLANT
BLADE SURG 15 STRL LF DISP TIS (BLADE) ×4 IMPLANT
BLADE SURG 15 STRL SS (BLADE) ×2
BNDG COHESIVE 4X5 TAN STRL (GAUZE/BANDAGES/DRESSINGS) ×3 IMPLANT
BNDG COHESIVE 6X5 TAN STRL LF (GAUZE/BANDAGES/DRESSINGS) ×3 IMPLANT
BNDG ESMARK 6X9 LF (GAUZE/BANDAGES/DRESSINGS) ×3
CHLORAPREP W/TINT 26ML (MISCELLANEOUS) ×3 IMPLANT
COVER BACK TABLE 60X90IN (DRAPES) ×3 IMPLANT
CUFF TOURNIQUET SINGLE 34IN LL (TOURNIQUET CUFF) ×3 IMPLANT
DRAPE EXTREMITY T 121X128X90 (DRAPE) ×3 IMPLANT
DRAPE U-SHAPE 47X51 STRL (DRAPES) ×3 IMPLANT
DRSG EMULSION OIL 3X3 NADH (GAUZE/BANDAGES/DRESSINGS) ×3 IMPLANT
DRSG PAD ABDOMINAL 8X10 ST (GAUZE/BANDAGES/DRESSINGS) ×3 IMPLANT
ELECT REM PT RETURN 9FT ADLT (ELECTROSURGICAL) ×3
ELECTRODE REM PT RTRN 9FT ADLT (ELECTROSURGICAL) ×2 IMPLANT
GAUZE SPONGE 4X4 12PLY STRL (GAUZE/BANDAGES/DRESSINGS) ×3 IMPLANT
GLOVE BIO SURGEON STRL SZ8 (GLOVE) ×3 IMPLANT
GLOVE BIOGEL PI IND STRL 7.0 (GLOVE) ×2 IMPLANT
GLOVE BIOGEL PI IND STRL 8 (GLOVE) ×2 IMPLANT
GLOVE BIOGEL PI INDICATOR 7.0 (GLOVE) ×1
GLOVE BIOGEL PI INDICATOR 8 (GLOVE) ×1
GLOVE ECLIPSE 6.5 STRL STRAW (GLOVE) ×3 IMPLANT
GLOVE EXAM NITRILE MD LF STRL (GLOVE) ×3 IMPLANT
GOWN STRL REUS W/ TWL LRG LVL3 (GOWN DISPOSABLE) ×2 IMPLANT
GOWN STRL REUS W/ TWL XL LVL3 (GOWN DISPOSABLE) ×2 IMPLANT
GOWN STRL REUS W/TWL LRG LVL3 (GOWN DISPOSABLE) ×1
GOWN STRL REUS W/TWL XL LVL3 (GOWN DISPOSABLE) ×1
NEEDLE HYPO 22GX1.5 SAFETY (NEEDLE) IMPLANT
NEEDLE HYPO 25X1 1.5 SAFETY (NEEDLE) IMPLANT
NS IRRIG 1000ML POUR BTL (IV SOLUTION) ×3 IMPLANT
PACK BASIN DAY SURGERY FS (CUSTOM PROCEDURE TRAY) ×3 IMPLANT
PAD CAST 4YDX4 CTTN HI CHSV (CAST SUPPLIES) ×2 IMPLANT
PADDING CAST ABS 4INX4YD NS (CAST SUPPLIES)
PADDING CAST ABS COTTON 4X4 ST (CAST SUPPLIES) IMPLANT
PADDING CAST COTTON 4X4 STRL (CAST SUPPLIES) ×1
PADDING CAST COTTON 6X4 STRL (CAST SUPPLIES) ×3 IMPLANT
PENCIL BUTTON HOLSTER BLD 10FT (ELECTRODE) ×3 IMPLANT
SANITIZER HAND PURELL 535ML FO (MISCELLANEOUS) ×3 IMPLANT
SHEET MEDIUM DRAPE 40X70 STRL (DRAPES) ×3 IMPLANT
SPLINT FAST PLASTER 5X30 (CAST SUPPLIES)
SPLINT PLASTER CAST FAST 5X30 (CAST SUPPLIES) IMPLANT
SPONGE LAP 18X18 X RAY DECT (DISPOSABLE) ×3 IMPLANT
STOCKINETTE 6  STRL (DRAPES) ×1
STOCKINETTE 6 STRL (DRAPES) ×2 IMPLANT
SUCTION FRAZIER TIP 10 FR DISP (SUCTIONS) IMPLANT
SUT ETHILON 3 0 PS 1 (SUTURE) ×3 IMPLANT
SUT MNCRL AB 3-0 PS2 18 (SUTURE) ×3 IMPLANT
SUT VIC AB 0 SH 27 (SUTURE) IMPLANT
SUT VIC AB 2-0 SH 18 (SUTURE) IMPLANT
SYR BULB 3OZ (MISCELLANEOUS) ×3 IMPLANT
SYR CONTROL 10ML LL (SYRINGE) IMPLANT
TOWEL OR 17X24 6PK STRL BLUE (TOWEL DISPOSABLE) ×6 IMPLANT
TUBE CONNECTING 20X1/4 (TUBING) IMPLANT
UNDERPAD 30X30 INCONTINENT (UNDERPADS AND DIAPERS) ×3 IMPLANT
YANKAUER SUCT BULB TIP NO VENT (SUCTIONS) IMPLANT

## 2014-02-13 NOTE — Transfer of Care (Signed)
Immediate Anesthesia Transfer of Care Note  Patient: Laurie [name]  Procedure(s) Performed: Procedure(s): RIGHT PLANTAR FASCIA RELEASE (Right) RIGHT GASTROCNEMIUS RECESSION (Right)  Patient Location: PACU  Anesthesia Type:GA combined with regional for post-op pain  Level of Consciousness: awake, sedated and patient cooperative  Airway & Oxygen Therapy: Patient Spontanous Breathing and Patient connected to face mask oxygen  Post-op Assessment: Report given to RN and Post -op Vital signs reviewed and stable  Post vital signs: Reviewed and stable  Last Vitals:  Filed Vitals:   02/13/14 1338  BP:   Pulse: 106  Temp:   Resp: 19    Complications: No apparent anesthesia complications

## 2014-02-13 NOTE — Anesthesia Procedure Notes (Addendum)
Procedure Name: LMA Insertion Date/Time: 02/13/2014 1:48 PM Performed by: Lyndee Leo Pre-anesthesia Checklist: Patient identified, Emergency Drugs available, Suction available and Patient being monitored Patient Re-evaluated:Patient Re-evaluated prior to inductionOxygen Delivery Method: Circle System Utilized Preoxygenation: Pre-oxygenation with 100% oxygen Intubation Type: IV induction Ventilation: Mask ventilation without difficulty LMA: LMA inserted LMA Size: 4.0 Number of attempts: 1 Airway Equipment and Method: Bite block Placement Confirmation: positive ETCO2 Tube secured with: Tape Dental Injury: Teeth and Oropharynx as per pre-operative assessment     Anesthesia Regional Block:  Popliteal block  Pre-Anesthetic Checklist: ,, timeout performed, Correct Patient, Correct Site, Correct Laterality, Correct Procedure, Correct Position, site marked, Risks and benefits discussed,  Surgical consent,  Pre-op evaluation,  At surgeon's request and post-op pain management  Laterality: Right and Lower  Prep: chloraprep       Needles:  Injection technique: Single-shot  Needle Type: Echogenic Needle     Needle Length: 9cm 9 cm Needle Gauge: 21 and 21 G    Additional Needles:  Procedures: ultrasound guided (picture in chart) Popliteal block Narrative:  Start time: 02/13/2014 1:29 PM End time: 02/13/2014 1:32 PM Injection made incrementally with aspirations every 5 mL.  Performed by: Personally  Anesthesiologist: Callee Rohrig A   Anesthesia Regional Block:  Adductor canal block  Pre-Anesthetic Checklist: ,, timeout performed, Correct Patient, Correct Site, Correct Laterality, Correct Procedure, Correct Position, site marked, Risks and benefits discussed,  Surgical consent,  Pre-op evaluation,  At surgeon's request and post-op pain management  Laterality: Right and Lower  Prep: chloraprep       Needles:  Injection technique: Single-shot  Needle Type: Echogenic  Needle     Needle Length: 9cm 9 cm Needle Gauge: 21 and 21 G    Additional Needles:  Procedures: ultrasound guided (picture in chart) Adductor canal block Narrative:  Start time: 02/13/2014 1:32 PM End time: 02/13/2014 1:35 PM Injection made incrementally with aspirations every 5 mL.  Performed by: Personally  Anesthesiologist: [place], [place]

## 2014-02-13 NOTE — Brief Op Note (Signed)
02/13/2014  2:25 PM  PATIENT:  Laurie [name]  34 y.o. female  PRE-OPERATIVE DIAGNOSIS:  right chronic plantar fasciitis with a tight heel cord  POST-OPERATIVE DIAGNOSIS:  right chronic plantar fasciitis with a tight heel cord  Procedure(s): RIGHT PLANTAR FASCIA RELEASE RIGHT GASTROCNEMIUS RECESSION through a separate incision  SURGEON:  Wylene Simmer, MD  ASSISTANT: n/a  ANESTHESIA:   General, regional  EBL:  minimal   TOURNIQUET:   Total Tourniquet Time Documented: Thigh (Right) - 18 minutes Total: Thigh (Right) - 18 minutes   COMPLICATIONS:  None apparent  DISPOSITION:  Extubated, awake and stable to recovery.  DICTATION ID:  [id number]

## 2014-02-13 NOTE — Anesthesia Preprocedure Evaluation (Signed)
Anesthesia Evaluation  Patient identified by MRN, date of birth, ID band Patient awake    Reviewed: Allergy & Precautions, NPO status , Patient's Chart, lab work & pertinent test results  Airway Mallampati: I  TM Distance: >3 FB Neck ROM: Full    Dental  (+) Teeth Intact, Dental Advisory Given   Pulmonary  breath sounds clear to auscultation        Cardiovascular Rhythm:Regular Rate:Normal     Neuro/Psych    GI/Hepatic   Endo/Other    Renal/GU      Musculoskeletal   Abdominal   Peds  Hematology   Anesthesia Other Findings   Reproductive/Obstetrics                             Anesthesia Physical Anesthesia Plan  ASA: I  Anesthesia Plan: General   Post-op Pain Management: MAC Combined w/ Regional for Post-op pain   Induction: Intravenous  Airway Management Planned: LMA  Additional Equipment:   Intra-op Plan:   Post-operative Plan: Extubation in OR  Informed Consent: I have reviewed the patients History and Physical, chart, labs and discussed the procedure including the risks, benefits and alternatives for the proposed anesthesia with the patient or authorized representative who has indicated his/her understanding and acceptance.   Dental advisory given  Plan Discussed with: CRNA, Anesthesiologist and Surgeon  Anesthesia Plan Comments:         Anesthesia Quick Evaluation

## 2014-02-13 NOTE — Discharge Instructions (Signed)
Wylene Simmer, MD [place]  Please read the following information regarding your care after surgery.  Medications  You only need a prescription for the narcotic pain medicine (ex. oxycodone, Percocet, Norco).  All of the other medicines listed below are available over the counter. X acetominophen (Tylenol) 650 mg every 4-6 hours as you need for minor pain X oxycodone as prescribed for moderate to severe pain   Narcotic pain medicine (ex. oxycodone, Percocet, Vicodin) will cause constipation.  To prevent this problem, take the following medicines while you are taking any pain medicine. X docusate sodium (Colace) 100 mg twice a day X senna (Senokot) 2 tablets twice a day  Weight Bearing X Bear weight when you are able on your operated leg or foot in the CAM boot.  Cast / Splint / Dressing X Keep your dressing clean and dry.  Dont put anything (coat hanger, pencil, etc) down inside of it.  If it gets damp, use a hair dryer on the cool setting to dry it.  If it gets soaked, call the office to schedule an appointment for a dressing change.  After your dressing, cast or splint is removed; you may shower, but do not soak or scrub the wound.  Allow the water to run over it, and then gently pat it dry.  Swelling It is normal for you to have swelling where you had surgery.  To reduce swelling and pain, keep your toes above your nose for at least 3 days after surgery.  It may be necessary to keep your foot or leg elevated for several weeks.  If it hurts, it should be elevated.  Follow Up Call my office at [phone] when you are discharged from the hospital or surgery center to schedule an appointment to be seen two weeks after surgery.  Call my office at [phone] if you develop a fever >101.5 F, nausea, vomiting, bleeding from the surgical site or severe pain.    Regional Anesthesia Blocks  1. Numbness or the inability to move the "blocked" extremity may last from 3-48 hours  after placement. The length of time depends on the medication injected and your individual response to the medication. If the numbness is not going away after 48 hours, call your surgeon.  2. The extremity that is blocked will need to be protected until the numbness is gone and the  Strength has returned. Because you cannot feel it, you will need to take extra care to avoid injury. Because it may be weak, you may have difficulty moving it or using it. You may not know what position it is in without looking at it while the block is in effect.  3. For blocks in the legs and feet, returning to weight bearing and walking needs to be done carefully. You will need to wait until the numbness is entirely gone and the strength has returned. You should be able to move your leg and foot normally before you try and bear weight or walk. You will need someone to be with you when you first try to ensure you do not fall and possibly risk injury.  4. Bruising and tenderness at the needle site are common side effects and will resolve in a few days.  5. Persistent numbness or new problems with movement should be communicated to the surgeon or the [place] [phone] [place] [phone]).   Post Anesthesia Home Care Instructions  Activity: Get plenty of rest for the remainder of the  day. A responsible adult should stay with you for 24 hours following the procedure.  For the next 24 hours, DO NOT: -Drive a car -Paediatric nurse -Drink alcoholic beverages -Take any medication unless instructed by your physician -Make any legal decisions or sign important papers.  Meals: Start with liquid foods such as gelatin or soup. Progress to regular foods as tolerated. Avoid greasy, spicy, heavy foods. If nausea and/or vomiting occur, drink only clear liquids until the nausea and/or vomiting subsides. Call your physician if vomiting continues.  Special Instructions/Symptoms: Your  throat may feel dry or sore from the anesthesia or the breathing tube placed in your throat during surgery. If this causes discomfort, gargle with warm salt water. The discomfort should disappear within 24 hours.

## 2014-02-13 NOTE — Anesthesia Postprocedure Evaluation (Signed)
  Anesthesia Post-op Note  Patient: Laurie [name]  Procedure(s) Performed: Procedure(s): RIGHT PLANTAR FASCIA RELEASE (Right) RIGHT GASTROCNEMIUS RECESSION (Right)  Patient Location: PACU  Anesthesia Type: General with regional block for post op pain   Level of Consciousness: awake, alert  and oriented  Airway and Oxygen Therapy: Patient Spontanous Breathing  Post-op Pain: none  Post-op Assessment: Post-op Vital signs reviewed  Post-op Vital Signs: Reviewed  Last Vitals:  Filed Vitals:   02/13/14 1515  BP: 122/73  Pulse: 74  Temp:   Resp: 15    Complications: No apparent anesthesia complications

## 2014-02-13 NOTE — Progress Notes (Signed)
Assisted Dr. Crews with right, ultrasound guided, popliteal/saphenous block. Side rails up, monitors on throughout procedure. See vital signs in flow sheet. Tolerated Procedure well. 

## 2014-02-13 NOTE — H&P (Signed)
Laurie [name] is an 34 y.o. female.   Chief Complaint:  Right heel pain HPI:  34 y/o female with right chronic plantar fasciitis and tight right heelcord.  She has failed non operative treatment to date and presents now for gastrocnemius recession and plantar fascia release.  She is concerned that she may be pregnant.  She had a serum betahcg test on Tuesday that was negative at her OB's office.  Past Medical History  Diagnosis Date  . Family history of adverse reaction to anesthesia     states twin sister is hard to arouse post-op  . Plantar fasciitis of right foot 02/2014  . Tightness of right heel cord 02/2014    Past Surgical History  Procedure Laterality Date  . Wisdom tooth extraction    . Ercp  2009    with sphincterotomy  . Carpal tunnel release Left 10/04/2012    Procedure: CARPAL TUNNEL RELEASE;  Surgeon: Tennis Must, MD;  Location: [facility];  Service: Orthopedics;  Laterality: Left;  Marland Kitchen Mass excision Left 12/18/2012    Procedure: EXCISION OF LEFT POSTERIOR  SHOULDER DERMATOFIBROMA;  Surgeon: Gwenyth Ober, MD;  Location: [facility];  Service: General;  Laterality: Left;  . Carpal tunnel release Right 12/24/2012    Procedure: RIGHT CARPAL TUNNEL RELEASE;  Surgeon: Tennis Must, MD;  Location: [facility];  Service: Orthopedics;  Laterality: Right;    Family History  Problem Relation Age of Onset  . Hypertension Mother   . COPD Maternal Grandmother   . Hyperlipidemia Father    Social History:  reports that she has never smoked. She has never used smokeless tobacco. She reports that she drinks alcohol. She reports that she does not use illicit drugs.  Allergies: No Known Allergies  Medications Prior to Admission  Medication Sig Dispense Refill  . Prenatal Vit-Fe Fumarate-FA (PRENATAL MULTIVITAMIN) TABS tablet Take 1 tablet by mouth at bedtime. 30 tablet 12    Results for orders placed or performed during the hospital  encounter of 02/13/14 (from the past 48 hour(s))  hCG, quantitative, pregnancy     Status: None   Collection Time: 02/13/14 12:15 PM  Result Value Ref Range   hCG, Beta Chain, Quant, S <1 <5 mIU/mL    Comment:          GEST. AGE      CONC.  (mIU/mL)   <=1 WEEK        5 - 50     2 WEEKS       50 - 500     3 WEEKS       100 - 10,000     4 WEEKS     1,000 - 30,000     5 WEEKS     3,500 - 115,000   6-8 WEEKS     12,000 - 270,000    12 WEEKS     15,000 - 220,000        FEMALE AND NON-PREGNANT FEMALE:     LESS THAN 5 mIU/mL   Hemoglobin-hemacue, POC     Status: None   Collection Time: 02/13/14 12:16 PM  Result Value Ref Range   Hemoglobin 14.5 12.0 - 15.0 g/dL   No results found.  ROS  No recent f/c/n/v/wt loss.  Blood pressure 131/96, pulse 77, temperature 97.8 F (36.6 C), temperature source Oral, resp. rate 20, height 5\' 6"  (1.676 m), weight 82.101 kg (181 lb), last menstrual period 01/15/2014, SpO2  100 %, not currently breastfeeding. Physical Exam  wn wd young woman in nad.  A and O x 4.  Mood and affect normal.  EOMI.  Resp unlabored.  R foot ttp at origin of the PF.  No lymphadenoapthy  Heelcord is tight.  5/5 strength in plantarflexion at the ankle and toes.  Sens to LT intact at the foot.  Assessment/Plan R chronic plantar fasciitis and tight heelcord.  To OR for gastroc recession and plantar fascia release.  The risks and benefits of the alternative treatment options have been discussed in detail.  The patient wishes to proceed with surgery and specifically understands risks of bleeding, infection, nerve damage, blood clots, need for additional surgery, continued pain, amputation and death.   Wylene Simmer 03-15-2014, 1:24 PM

## 2014-02-14 NOTE — Op Note (Signed)
Laurie [name], Laurie [name]            ACCOUNT NO.:  [id number]  MEDICAL RECORD NO.:  [id number]  LOCATION:                                 FACILITY:  PHYSICIAN:  Wylene Simmer, MD        DATE OF BIRTH:  August 22, 1980  DATE OF PROCEDURE:  02/13/2014 DATE OF DISCHARGE:                              OPERATIVE REPORT   PREOPERATIVE DIAGNOSES: 1. Right chronic plantar fasciitis. 2. Tight right heel cord.  POSTOPERATIVE DIAGNOSES: 1. Right chronic plantar fasciitis. 2. Tight right heel cord.  PROCEDURES: 1. Right plantar fascia release. 2. Right gastrocnemius recession through a separate incision.  SURGEON:  Wylene Simmer, MD.  ANESTHESIA:  General, regional.  ESTIMATED BLOOD LOSS:  Minimal.  TOURNIQUET TIME:  18 minutes at 250 mmHg.  COMPLICATIONS:  None apparent.  DISPOSITION:  Extubated awake and stable to recovery.  INDICATIONS FOR PROCEDURE:  The patient is a 34 year old woman with a history of chronic plantar fasciitis on the right foot.  She has failed nonoperative treatment to date including activity modification, oral anti-inflammatories, orthotics, physical therapy, and steroid injections.  She has had symptoms for more than a year and presents today for operative treatment of this painful condition.  She understands the risks and benefits, the alternative treatment options, and elects surgical treatment.  She specifically understands risks of bleeding, infection, nerve damage, blood clots, need for additional surgery, continued pain, recurrence of her condition, amputation, and death.  PROCEDURE IN DETAIL:  After preoperative consent was obtained and the correct operative site was identified, the patient was brought to the operating room and placed supine on the operating table.  General anesthesia was induced.  Preoperative antibiotics were administered. Surgical time-out was taken.  The right lower extremity was prepped and draped in standard sterile fashion with  tourniquet around the thigh. The extremity was exsanguinated and the tourniquet was inflated to 250 mmHg.  A longitudinal incision was then made over the medial calf. Sharp dissection was carried down through skin and subcutaneous tissue. Care was taken to protect the saphenous nerve and vein.  Superficial fascia was incised.  The gastrocnemius tendon was identified.  Sural nerve was protected as the gastrocnemius tendon was divided from medial to lateral under direct vision.  The plantaris tendon was also divided under direct vision.  Wound was irrigated.  The ankle would dorsiflex approximately 30 degrees with the knee extended.  The wound was then closed with Monocryl and nylon.  Attention was then turned to the medial aspect of the midfoot.  The patient's plantar fascia was palpated within the proximal portion of the arch.  A small incision was made at the glabrous border of the skin. Sharp dissection was carried down through the subcutaneous tissue to the medial edge of the plantar fascia.  Dissection was then carried superficially and plantarly to the plantar fascia and it was divided under direct vision approximately 2/3rd of its width.  The wound was irrigated and closed with Monocryl and nylon.  Sterile dressings were applied followed by a compression wrap.  Tourniquet was released at 18 minutes.  The patient was awakened from anesthesia and transported to the recovery room in stable condition.  Prior  to dressing the wound, 10 mL of 0.5% Marcaine with epinephrine was infiltrated into the subcutaneous tissue around the proximal incision.  FOLLOWUP PLAN:  The patient will be weightbearing as tolerated on her right foot in a cam walker boot.  She will stay off the foot as much as possible aside from getting up to the bathroom and to eat.  She will follow up with me in 2 weeks for suture removal and to initiate physical therapy.     Wylene Simmer, MD     JH/MEDQ  D:   02/13/2014  T:  02/14/2014  Job:  [id number]

## 2014-02-14 NOTE — Addendum Note (Signed)
Addendum  created 02/14/14 8677 by Tawni Millers, CRNA   Modules edited: Charges VN

## 2014-02-17 ENCOUNTER — Encounter (HOSPITAL_BASED_OUTPATIENT_CLINIC_OR_DEPARTMENT_OTHER): Payer: Self-pay | Admitting: Orthopedic Surgery

## 2014-08-01 LAB — OB RESULTS CONSOLE ABO/RH: RH Type: NEGATIVE

## 2014-08-01 LAB — OB RESULTS CONSOLE RUBELLA ANTIBODY, IGM: Rubella: IMMUNE

## 2014-08-01 LAB — OB RESULTS CONSOLE GC/CHLAMYDIA
CHLAMYDIA, DNA PROBE: NEGATIVE
GC PROBE AMP, GENITAL: NEGATIVE

## 2014-08-01 LAB — OB RESULTS CONSOLE ANTIBODY SCREEN: Antibody Screen: NEGATIVE

## 2014-08-01 LAB — OB RESULTS CONSOLE HIV ANTIBODY (ROUTINE TESTING): HIV: NONREACTIVE

## 2014-08-01 LAB — OB RESULTS CONSOLE RPR: RPR: NONREACTIVE

## 2014-08-01 LAB — OB RESULTS CONSOLE HEPATITIS B SURFACE ANTIGEN: Hepatitis B Surface Ag: NEGATIVE

## 2014-09-15 ENCOUNTER — Other Ambulatory Visit: Payer: Self-pay | Admitting: Dermatology

## 2014-09-20 ENCOUNTER — Encounter (HOSPITAL_COMMUNITY): Payer: Self-pay | Admitting: *Deleted

## 2014-09-20 ENCOUNTER — Inpatient Hospital Stay (HOSPITAL_COMMUNITY)
Admission: AD | Admit: 2014-09-20 | Discharge: 2014-09-20 | Disposition: A | Discharge status: Home / Self Care | Payer: No Typology Code available for payment source | Source: Ambulatory Visit | Attending: Obstetrics and Gynecology | Admitting: Obstetrics and Gynecology

## 2014-09-20 DIAGNOSIS — Z3A17 17 weeks gestation of pregnancy: Secondary | ICD-10-CM | POA: Diagnosis not present

## 2014-09-20 DIAGNOSIS — O26892 Other specified pregnancy related conditions, second trimester: Secondary | ICD-10-CM | POA: Insufficient documentation

## 2014-09-20 DIAGNOSIS — W101XXA Fall (on)(from) sidewalk curb, initial encounter: Secondary | ICD-10-CM | POA: Diagnosis not present

## 2014-09-20 DIAGNOSIS — W19XXXA Unspecified fall, initial encounter: Secondary | ICD-10-CM

## 2014-09-20 HISTORY — DX: Female infertility associated with anovulation: N97.0

## 2014-09-20 LAB — PROTIME-INR
INR: 1.09 (ref 0.00–1.49)
Prothrombin Time: 14.3 seconds (ref 11.6–15.2)

## 2014-09-20 LAB — APTT: aPTT: 28 seconds (ref 24–37)

## 2014-09-20 LAB — FIBRINOGEN: Fibrinogen: 427 mg/dL (ref 204–475)

## 2014-09-20 NOTE — Discharge Instructions (Signed)
What Do I Need to Know About Injuries During Pregnancy? °Injuries can happen during pregnancy. Minor falls and accidents usually do not harm you or your baby. However, any injury should be reported to your doctor. °WHAT CAN I DO TO PROTECT MYSELF FROM INJURIES? °· Remove rugs and loose objects on the floor. °· Wear comfortable shoes that have a good grip. Do not wear high-heeled shoes. °· Always wear your seat belt. The lap belt should be below your belly. Always practice safe driving. °· Do not ride on a motorcycle. °· Do not participate in high-impact activities or sports. °· Avoid: °¨ Walking on wet or slippery floors. °¨ Fires. °¨ Starting fires. °¨ Lifting heavy pots of boiling or hot liquids. °¨ Fixing electrical problems. °· Only take medicine as told by your doctor. °· Know your blood type and the blood type of the baby's father. °· Call your local emergency services (911 in the [place] if you are a victim of domestic violence or assault. For help and support, contact the National Domestic Violence Hotline. °WHEN SHOULD I GET HELP RIGHT AWAY? °· You fall on your belly or have any high-impact accident or injury. °· You have been a victim of domestic violence or any kind of violence. °· You have been in a car accident. °· You have bleeding from your vagina. °· Fluid is leaking from your vagina. °· You start to have belly cramping (contractions) or pain. °· You feel weak or pass out (faint). °· You start to throw up (vomit) after an injury. °· You have been burned. °· You have a stiff neck or neck pain. °· You get a headache or have vision problems after an injury. °· You do not feel the baby move or the baby is not moving as much as normal. °Document Released: 01/22/2010 Document Revised: 05/06/2013 Document Reviewed: 09/26/2012 °ExitCare® Patient Information ©2015 [employer]. This information is not intended to replace advice given to you by your health care provider. Make sure you discuss any questions you  have with your health care provider. ° °

## 2014-09-20 NOTE — Progress Notes (Signed)
Lab called for blood draw.

## 2014-09-20 NOTE — MAU Note (Signed)
Fell at work landed on left side hit abdomen.

## 2014-09-20 NOTE — MAU Provider Note (Signed)
History     CSN: [id number]  Arrival date and time: 09/20/14 1243   First Provider Initiated Contact with Patient 09/20/14 1327      Chief Complaint  Patient presents with  . Fall   HPI  Laurie [name] 34 y.o. G2P1001 [redacted]w[redacted]d presents to the MAU after falling off a curb. She states she hit her left hand, left side of abdomen and left knee. Reports no bleeding.  Past Medical History  Diagnosis Date  . Family history of adverse reaction to anesthesia     states twin sister is hard to arouse post-op  . Plantar fasciitis of right foot 02/2014  . 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 Infertility associated with anovulation     Past Surgical History  Procedure Laterality Date  . Wisdom tooth extraction    . Ercp  2009    with sphincterotomy  . Carpal tunnel release Left 10/04/2012    Procedure: CARPAL TUNNEL RELEASE;  Surgeon: Tennis Must, MD;  Location: [place];  Service: Orthopedics;  Laterality: Left;  Marland Kitchen Mass excision Left 12/18/2012    Procedure: EXCISION OF LEFT POSTERIOR  SHOULDER DERMATOFIBROMA;  Surgeon: Gwenyth Ober, MD;  Location: [place];  Service: General;  Laterality: Left;  . Carpal tunnel release Right 12/24/2012    Procedure: RIGHT CARPAL TUNNEL RELEASE;  Surgeon: Tennis Must, MD;  Location: [place];  Service: Orthopedics;  Laterality: Right;  . Plantar fascia release Right 02/13/2014    Procedure: RIGHT PLANTAR FASCIA RELEASE;  Surgeon: Wylene Simmer, MD;  Location: [place];  Service: Orthopedics;  Laterality: Right;  . Gastroc recession extremity Right 02/13/2014    Procedure: RIGHT GASTROCNEMIUS RECESSION;  Surgeon: Wylene Simmer, MD;  Location: [place];  Service: Orthopedics;  Laterality: Right;    Family History  Problem Relation Age of Onset  . Hypertension Mother   . COPD Maternal Grandmother   . Hyperlipidemia Father     Social History  Substance Use Topics   . Smoking status: Never Smoker   . Smokeless tobacco: Never Used  . Alcohol Use: Yes     Comment: occasionally    Allergies: No Known Allergies  Prescriptions prior to admission  Medication Sig Dispense Refill Last Dose  . oxyCODONE (ROXICODONE) 5 MG immediate release tablet Take 1-2 tablets (5-10 mg total) by mouth every 4 (four) hours as needed for moderate pain or severe pain. 20 tablet 0   . Prenatal Vit-Fe Fumarate-FA (PRENATAL MULTIVITAMIN) TABS tablet Take 1 tablet by mouth at bedtime. 30 tablet 12 Past Week at Unknown time    Review of Systems  Gastrointestinal: Positive for abdominal pain.       Left side of abdomen sore  Musculoskeletal:       Left hand and knee scraped   Physical Exam   Blood pressure 141/88, pulse 76, temperature 98.7 F (37.1 C), temperature source Oral, resp. rate 16, height 5\' 7"  (1.702 m), weight 82.283 kg (181 lb 6.4 oz), last menstrual period 01/15/2014.  Physical Exam  Nursing note and vitals reviewed. Constitutional: She is oriented to person, place, and time. She appears well-developed and well-nourished. No distress.  Cardiovascular: Normal rate.   Respiratory: Effort normal. No respiratory distress.  GI: Soft. She exhibits no distension and no mass. There is no tenderness. There is no rebound and no guarding.  Musculoskeletal: Normal range of motion.  Neurological: She is alert and oriented to person,  place, and time.  Skin: Skin is warm and dry.  Psychiatric: She has a normal mood and affect. Her behavior is normal. Judgment and thought content normal.   Results for orders placed or performed during the hospital encounter of 09/20/14 (from the past 24 hour(s))  Protime-INR     Status: None   Collection Time: 09/20/14  2:58 PM  Result Value Ref Range   Prothrombin Time 14.3 11.6 - 15.2 seconds   INR 1.09 0.00 - 1.49  APTT     Status: None   Collection Time: 09/20/14  2:58 PM  Result Value Ref Range   aPTT 28 24 - 37 seconds   Fibrinogen     Status: None   Collection Time: 09/20/14  2:58 PM  Result Value Ref Range   Fibrinogen 427 204 - 475 mg/dL   MAU Course  Procedures  MDM Pending labs and FHT's pt will be discharged to home per Dr. Terri Piedra. Positive FHT's prior to discharge  Assessment and Plan  Fall in pregnancy  Discharge to home  [facility] 09/20/2014, 1:27 PM

## 2015-01-04 NOTE — L&D Delivery Note (Signed)
Delivery Note At 8:05 AM a viable female was delivered via Vaginal, Spontaneous Delivery (Presentation: ; Occiput Anterior).  APGAR: 9, 9; weight pending.   Placenta status: Intact, Spontaneous.  Cord: 3 vessels with the following complications: None.  Anesthesia: Epidural  Episiotomy: None Lacerations: 2nd degree Suture Repair: 3.0 vicryl rapide Est. Blood Loss (mL): 200  Mom to postpartum.  Baby to Couplet care / Skin to Skin.  [name] D 02/25/2015, 8:35 AM

## 2015-01-12 ENCOUNTER — Inpatient Hospital Stay (HOSPITAL_COMMUNITY)
Admission: AD | Admit: 2015-01-12 | Discharge: 2015-01-12 | Disposition: A | Discharge status: Home / Self Care | Payer: BLUE CROSS/BLUE SHIELD | Source: Ambulatory Visit | Attending: Obstetrics and Gynecology | Admitting: Obstetrics and Gynecology

## 2015-01-12 ENCOUNTER — Encounter (HOSPITAL_COMMUNITY): Payer: Self-pay

## 2015-01-12 DIAGNOSIS — O9989 Other specified diseases and conditions complicating pregnancy, childbirth and the puerperium: Secondary | ICD-10-CM | POA: Diagnosis not present

## 2015-01-12 DIAGNOSIS — R000 Tachycardia, unspecified: Secondary | ICD-10-CM | POA: Diagnosis present

## 2015-01-12 DIAGNOSIS — O26893 Other specified pregnancy related conditions, third trimester: Secondary | ICD-10-CM | POA: Insufficient documentation

## 2015-01-12 DIAGNOSIS — R251 Tremor, unspecified: Secondary | ICD-10-CM | POA: Diagnosis present

## 2015-01-12 DIAGNOSIS — I472 Ventricular tachycardia: Secondary | ICD-10-CM

## 2015-01-12 DIAGNOSIS — Z3A33 33 weeks gestation of pregnancy: Secondary | ICD-10-CM | POA: Insufficient documentation

## 2015-01-12 DIAGNOSIS — E162 Hypoglycemia, unspecified: Secondary | ICD-10-CM

## 2015-01-12 DIAGNOSIS — I4729 Other ventricular tachycardia: Secondary | ICD-10-CM

## 2015-01-12 LAB — CBC WITH DIFFERENTIAL/PLATELET
Basophils Absolute: 0 10*3/uL (ref 0.0–0.1)
Basophils Relative: 0 %
Eosinophils Absolute: 0.1 10*3/uL (ref 0.0–0.7)
Eosinophils Relative: 1 %
HEMATOCRIT: 34 % — AB (ref 36.0–46.0)
HEMOGLOBIN: 11.2 g/dL — AB (ref 12.0–15.0)
LYMPHS ABS: 1.7 10*3/uL (ref 0.7–4.0)
LYMPHS PCT: 12 %
MCH: 30.1 pg (ref 26.0–34.0)
MCHC: 32.9 g/dL (ref 30.0–36.0)
MCV: 91.4 fL (ref 78.0–100.0)
MONOS PCT: 6 %
Monocytes Absolute: 0.9 10*3/uL (ref 0.1–1.0)
NEUTROS ABS: 11.5 10*3/uL — AB (ref 1.7–7.7)
NEUTROS PCT: 81 %
Platelets: 258 10*3/uL (ref 150–400)
RBC: 3.72 MIL/uL — AB (ref 3.87–5.11)
RDW: 13.7 % (ref 11.5–15.5)
WBC: 14.2 10*3/uL — AB (ref 4.0–10.5)

## 2015-01-12 LAB — URINALYSIS, ROUTINE W REFLEX MICROSCOPIC
Bilirubin Urine: NEGATIVE
Glucose, UA: NEGATIVE mg/dL
HGB URINE DIPSTICK: NEGATIVE
KETONES UR: NEGATIVE mg/dL
Nitrite: NEGATIVE
Protein, ur: NEGATIVE mg/dL
pH: 5.5 (ref 5.0–8.0)

## 2015-01-12 LAB — TSH: TSH: 1.723 u[IU]/mL (ref 0.350–4.500)

## 2015-01-12 LAB — COMPREHENSIVE METABOLIC PANEL
ALT: 11 U/L — ABNORMAL LOW (ref 14–54)
AST: 17 U/L (ref 15–41)
Albumin: 2.9 g/dL — ABNORMAL LOW (ref 3.5–5.0)
Alkaline Phosphatase: 111 U/L (ref 38–126)
Anion gap: 10 (ref 5–15)
BUN: 5 mg/dL — ABNORMAL LOW (ref 6–20)
CO2: 20 mmol/L — ABNORMAL LOW (ref 22–32)
Calcium: 8.9 mg/dL (ref 8.9–10.3)
Chloride: 107 mmol/L (ref 101–111)
Creatinine, Ser: 0.41 mg/dL — ABNORMAL LOW (ref 0.44–1.00)
GFR calc Af Amer: >60 mL/min (ref >60)
GFR calc non Af Amer: >60 mL/min (ref >60)
Glucose, Bld: 122 mg/dL — ABNORMAL HIGH (ref 65–99)
Potassium: 3.8 mmol/L (ref 3.5–5.1)
Sodium: 137 mmol/L (ref 135–145)
Total Bilirubin: 0.4 mg/dL (ref 0.3–1.2)
Total Protein: 6.9 g/dL (ref 6.5–8.1)

## 2015-01-12 LAB — URINE MICROSCOPIC-ADD ON: Bacteria, UA: NONE SEEN

## 2015-01-12 NOTE — MAU Provider Note (Signed)
History   G2P1 @ 33.4 in with c/o tachycardia, jitteriness, and feeling weak. This all started yesterday. Today it happened again after eating breakfast just like yesterday. Diet review yesterday oatmeal with brown sugar, today pt had cereal and on way to kitchen after eating it happened again. Pt states she took her pulse ox and sats were good but she was tachycardic.  CSN: [id number]  Arrival date & time 01/12/15  1242   None     Chief Complaint  Patient presents with  . Tachycardia  . Shortness of Breath    HPI  Past Medical History  Diagnosis Date  . Family history of adverse reaction to anesthesia     states twin sister is hard to arouse post-op  . Plantar fasciitis of right foot 02/2014  . 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 Infertility associated with anovulation     Past Surgical History  Procedure Laterality Date  . Wisdom tooth extraction    . Ercp  2009    with sphincterotomy  . Carpal tunnel release Left 10/04/2012    Procedure: CARPAL TUNNEL RELEASE;  Surgeon: Tennis Must, MD;  Location: [place];  Service: Orthopedics;  Laterality: Left;  Marland Kitchen Mass excision Left 12/18/2012    Procedure: EXCISION OF LEFT POSTERIOR  SHOULDER DERMATOFIBROMA;  Surgeon: Gwenyth Ober, MD;  Location: [place];  Service: General;  Laterality: Left;  . Carpal tunnel release Right 12/24/2012    Procedure: RIGHT CARPAL TUNNEL RELEASE;  Surgeon: Tennis Must, MD;  Location: [place];  Service: Orthopedics;  Laterality: Right;  . Plantar fascia release Right 02/13/2014    Procedure: RIGHT PLANTAR FASCIA RELEASE;  Surgeon: Wylene Simmer, MD;  Location: [place];  Service: Orthopedics;  Laterality: Right;  . Gastroc recession extremity Right 02/13/2014    Procedure: RIGHT GASTROCNEMIUS RECESSION;  Surgeon: Wylene Simmer, MD;  Location: [place];  Service: Orthopedics;  Laterality: Right;    Family History   Problem Relation Age of Onset  . Hypertension Mother   . COPD Maternal Grandmother   . Hyperlipidemia Father     Social History  Substance Use Topics  . Smoking status: Never Smoker   . Smokeless tobacco: Never Used  . Alcohol Use: Yes     Comment: occasionally    OB History    Gravida Para Term Preterm AB TAB SAB Ectopic Multiple Living   2 1 1       1       Review of Systems  HENT: Negative.   Eyes: Negative.   Respiratory: Positive for shortness of breath.   Cardiovascular: Negative.   Gastrointestinal: Negative.   Endocrine: Negative.   Genitourinary: Negative.   Musculoskeletal: Negative.   Skin: Negative.   Allergic/Immunologic: Negative.   Neurological: Positive for dizziness and weakness.  Hematological: Negative.   Psychiatric/Behavioral: Negative.     Allergies  Review of patient's allergies indicates no known allergies.  Home Medications  No current outpatient prescriptions on file.  BP 133/74 mmHg  Pulse 117  Temp(Src) 98.8 F (37.1 C) (Oral)  Resp 18  Ht 5\' 6"  (1.676 m)  Wt 190 lb (86.183 kg)  BMI 30.68 kg/m2  SpO2 98%  LMP 01/15/2014 (Exact Date)  Physical Exam  Constitutional: She is oriented to person, place, and time. She appears well-developed and well-nourished.  HENT:  Head: Normocephalic.  Eyes: Pupils are equal, round, and reactive to light.  Neck: Normal range  of motion.  Cardiovascular: Normal rate, regular rhythm, normal heart sounds and intact distal pulses.   Pulmonary/Chest: Effort normal and breath sounds normal.  Abdominal: Soft. Bowel sounds are normal.  Genitourinary: Vagina normal and uterus normal.  Musculoskeletal: Normal range of motion.  Neurological: She is alert and oriented to person, place, and time. She has normal reflexes.  Skin: Skin is warm and dry.  Psychiatric: She has a normal mood and affect. Her behavior is normal. Judgment and thought content normal.    MAU Course  Procedures (including critical  care time)  Labs Reviewed  URINALYSIS, ROUTINE W REFLEX MICROSCOPIC (NOT AT [facility]) - Abnormal; Notable for the following:    Specific Gravity, Urine <1.005 (*)    Leukocytes, UA TRACE (*)    All other components within normal limits  URINE MICROSCOPIC-ADD ON - Abnormal; Notable for the following:    Squamous Epithelial / LPF 0-5 (*)    All other components within normal limits   No results found.   No diagnosis found.    MDM  EKG show sinus tach at 103. CBC and CMP results discussed with Dr. Terri Piedra  Dx: hypoglycemia Plan d/c home high protein diet F/U Dr. Terri Piedra end of week.

## 2015-01-12 NOTE — MAU Note (Signed)
Pt c/o feeling shortness of breath, tachycardia, diaphoretic, and lightheadedness lasting about an hour. Pt drank some water and felt better. This morning pt was resting and felt the same feelings again. Pt c/o feeling sweaty, short of breath, and tachycardic. Pt has a pulse ox at home and pulse was 115-130 with good oxygen saturations. Pt drank three bottles of water at home with no relief.

## 2015-01-12 NOTE — MAU Note (Signed)
Urine in lab 

## 2015-01-14 ENCOUNTER — Ambulatory Visit (INDEPENDENT_AMBULATORY_CARE_PROVIDER_SITE_OTHER): Payer: BLUE CROSS/BLUE SHIELD | Admitting: Physician Assistant

## 2015-01-14 ENCOUNTER — Encounter: Payer: Self-pay | Admitting: Physician Assistant

## 2015-01-14 ENCOUNTER — Other Ambulatory Visit: Payer: Self-pay | Admitting: Physician Assistant

## 2015-01-14 VITALS — BP 120/70 | HR 108 | Ht 66.0 in | Wt 197.8 lb

## 2015-01-14 DIAGNOSIS — R42 Dizziness and giddiness: Secondary | ICD-10-CM

## 2015-01-14 DIAGNOSIS — R000 Tachycardia, unspecified: Secondary | ICD-10-CM

## 2015-01-14 DIAGNOSIS — R0602 Shortness of breath: Secondary | ICD-10-CM | POA: Diagnosis not present

## 2015-01-14 NOTE — Patient Instructions (Addendum)
Medication Instructions:  Your physician recommends that you continue on your current medications as directed. Please refer to the Current Medication list given to you today.   Labwork: None ordered  Testing/Procedures: Your physician has requested that you have an echocardiogram. Echocardiography is a painless test that uses sound waves to create images of your heart. It provides your doctor with information about the size and shape of your heart and how well your heart's chambers and valves are working. This procedure takes approximately one hour. There are no restrictions for this procedure.    Follow-Up: Your physician recommends that you schedule a follow-up appointment in:  2 MONTHS WITH 1ST AVAILABLE CARDIOLOGIST  Any Other Special Instructions Will Be Listed Below (If Applicable).  Maintain hydration  Echocardiogram An echocardiogram, or echocardiography, uses sound waves (ultrasound) to produce an image of your heart. The echocardiogram is simple, painless, obtained within a short period of time, and offers valuable information to your health care provider. The images from an echocardiogram can provide information such as:  Evidence of coronary artery disease (CAD).  Heart size.  Heart muscle function.  Heart valve function.  Aneurysm detection.  Evidence of a past heart attack.  Fluid buildup around the heart.  Heart muscle thickening.  Assess heart valve function. LET [facility] CARE PROVIDER KNOW ABOUT:  Any allergies you have.  All medicines you are taking, including vitamins, herbs, eye drops, creams, and over-the-counter medicines.  Previous problems you or members of your family have had with the use of anesthetics.  Any blood disorders you have.  Previous surgeries you have had.  Medical conditions you have.  Possibility of pregnancy, if this applies. BEFORE THE PROCEDURE  No special preparation is needed. Eat and drink normally.  PROCEDURE    In order to produce an image of your heart, gel will be applied to your chest and a wand-like tool (transducer) will be moved over your chest. The gel will help transmit the sound waves from the transducer. The sound waves will harmlessly bounce off your heart to allow the heart images to be captured in real-time motion. These images will then be recorded.  You may need an IV to receive a medicine that improves the quality of the pictures. AFTER THE PROCEDURE You may return to your normal schedule including diet, activities, and medicines, unless your health care provider tells you otherwise.   This information is not intended to replace advice given to you by your health care provider. Make sure you discuss any questions you have with your health care provider.   Document Released: 12/18/1999 Document Revised: 01/10/2014 Document Reviewed: 08/27/2012 Elsevier Interactive Patient Education [employer].    If you need a refill on your cardiac medications before your next appointment, please call your pharmacy.

## 2015-01-14 NOTE — Progress Notes (Addendum)
Cardiology Office Note   Date:  01/14/2015   ID:  Laurie [name], DOB 1980/08/11, MRN [id number]  PCP:  Rachell Cipro, MD  Cardiologist:  new - seen with DOD Dr. Rayann Heman Referred by Dr. Graylon Good [facility]  Chief Complaint  Patient presents with  . New Patient (Initial Visit)    DOD Dr. Rayann Heman, seen for tachycardia      History of Present Illness: Laurie [name] is a 35 y.o. female who is currently [redacted] weeks pregnant who presents for new patient cardiology visit. She has no prior cardiac history. Her father did pass away from light chain amyloidosis when he was 34 years old from cardiac arrhythmia during shower. It appears patient had a Holter monitor placed in grad school when she was 35 years old. She was trying to quit smoking at the time and was on Wellbutrin, she attributed to PVCs to Wellbutrin. According to the patient, she was told there was no significant finding found on Holter monitor. Patient was recently seen at [facility] on 01/12/2015 for tachycardia, jitteriness, and the feeling weak and started the day before. Initial EKG showed a sinus tachycardia with heart rate over 100. Laboratory finding was significant for creatinine of 0.41, white blood cell count 14.2, hemoglobin 11.2. TSH was normal. Urinalysis was negative for UTI. Patient was discharged home with a diagnosis of hypoglycemia. Although the last blood glucose was 122.  Patient was referred to cardiology service today for evaluation of underlying etiology behind sinus tachycardia episodes, episodic SOB and dizziness. According to the patient, she first noted the symptoms 3 days ago after she ate breakfast. She had dizziness, and shortness breath which lasted for about an hour. Her pulse was mildly elevated at the time. The symptom recurred episodically throughout the day however did not last as long as the first episode. On the second day, she had recurrence of the symptom. She sought  medical attention at [facility] and was told she may have hypoglycemia. However she says her BG has been stable the entire time and also her symptom occurred after eating breakfast. She also has been checking her O2 saturation with a pulse ox throughout the day, she denies any desaturation episodes despite feeling short of breath. Denies any recent chest discomfort or lower extremity swelling suggestive of DVT or PE. She did not have any similar symptom during prepartum stage with her first daughter 2 years ago.     Past Medical History  Diagnosis Date  . Family history of adverse reaction to anesthesia     states twin sister is hard to arouse post-op  . Plantar fasciitis of right foot 02/2014  . 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 Infertility associated with anovulation     Past Surgical History  Procedure Laterality Date  . Wisdom tooth extraction    . Ercp  2009    with sphincterotomy  . Carpal tunnel release Left 10/04/2012    Procedure: CARPAL TUNNEL RELEASE;  Surgeon: Tennis Must, MD;  Location: [place];  Service: Orthopedics;  Laterality: Left;  Marland Kitchen Mass excision Left 12/18/2012    Procedure: EXCISION OF LEFT POSTERIOR  SHOULDER DERMATOFIBROMA;  Surgeon: Gwenyth Ober, MD;  Location: [place];  Service: General;  Laterality: Left;  . Carpal tunnel release Right 12/24/2012    Procedure: RIGHT CARPAL TUNNEL RELEASE;  Surgeon: Tennis Must, MD;  Location: [place];  Service: Orthopedics;  Laterality: Right;  .  Plantar fascia release Right 02/13/2014    Procedure: RIGHT PLANTAR FASCIA RELEASE;  Surgeon: Wylene Simmer, MD;  Location: [place];  Service: Orthopedics;  Laterality: Right;  . Gastroc recession extremity Right 02/13/2014    Procedure: RIGHT GASTROCNEMIUS RECESSION;  Surgeon: Wylene Simmer, MD;  Location: [place];  Service: Orthopedics;  Laterality: Right;     Current Outpatient  Prescriptions  Medication Sig Dispense Refill  . Prenatal Vit-Fe Fumarate-FA (PRENATAL MULTIVITAMIN) TABS tablet Take 1 tablet by mouth at bedtime. 30 tablet 12  . ranitidine (ZANTAC) 300 MG tablet Take 300 mg by mouth at bedtime.     No current facility-administered medications for this visit.    Allergies:   Review of patient's allergies indicates no known allergies.    Social History:  The patient  reports that she has quit smoking. She has never used smokeless tobacco. She reports that she drinks alcohol. She reports that she does not use illicit drugs.   Family History:  The patient's family history includes COPD in her maternal grandmother; Hyperlipidemia in her father; Hypertension in her mother; Stroke in her maternal grandfather. There is no history of Heart attack.    ROS:  Please see the history of present illness.   Otherwise, review of systems are positive for episodic SOB and dizziness.   All other systems are reviewed and negative.    PHYSICAL EXAM: VS:  BP 120/70 mmHg  Pulse 108  Ht 5\' 6"  (1.676 m)  Wt 197 lb 12.8 oz (89.721 kg)  BMI 31.94 kg/m2  LMP 01/15/2014 (Exact Date) , BMI Body mass index is 31.94 kg/(m^2). GEN: Well nourished, well developed, in no acute distress HEENT: normal Neck: no JVD, carotid bruits, or masses Cardiac: RRR; no murmurs, rubs, or gallops. Nonpitting edema noted in bilateral LE  Respiratory:  clear to auscultation bilaterally, normal work of breathing GI: soft, nontender, nondistended, + BS MS: no deformity or atrophy Skin: warm and dry, no rash Neuro:  Strength and sensation are intact Psych: euthymic mood, full affect   EKG:  EKG is ordered today. The ekg ordered today demonstrates mild sinus tach with HR 100s, no significant ST-T wave changes   Recent Labs: 01/12/2015: ALT 11*; BUN 5*; Creatinine, Ser 0.41*; Hemoglobin 11.2*; Platelets 258; Potassium 3.8; Sodium 137; TSH 1.723    Lipid Panel No results found for: CHOL, TRIG,  HDL, CHOLHDL, VLDL, LDLCALC, LDLDIRECT    Wt Readings from Last 3 Encounters:  01/14/15 197 lb 12.8 oz (89.721 kg)  01/12/15 190 lb (86.183 kg)  09/20/14 181 lb 6.4 oz (82.283 kg)      Other studies Reviewed: Additional studies/ records that were reviewed today include:   Recent [facility] record.   Review of the above records demonstrates:   Recent onset of dizziness and SOB   ASSESSMENT AND PLAN:  1.  Episodic SOB and dizziness  - likely related to her 34 weeks pregnancy, low probability of PE, will obtain echo without contrast to r/o structural heart disease. Patient seen with DOD Dr. Rayann Heman who agree with the plan  - her tachycardia seems to only go up to 100-120s, will hold off on starting PRN propranolol. Despite its safety profile in pregnancy, her tachycardia is not significant enough to warrant new medication at this time  - although her father died of arrhythmia and light chain amyloidosis, there is not definitive sign on exam to warrant any invasive biopsy. She does have some fluid accumulated in the legs,  however not unexpected given her pregnancy, otherwise no sign of HF on exam or by history  - her symptom likely will improve after delivery, during mean time, Dr. Rayann Heman recommend keep up with her hydration   Current medicines are reviewed at length with the patient today.  The patient does not have concerns regarding medicines.  The following changes have been made:  no change  Labs/ tests ordered today include: Echocardiogram without contrast  Orders Placed This Encounter  Procedures  . EKG 12-Lead  . Echocardiogram  . Echocardiogram     Disposition:   FU with general cardiologist in 2 months  Hilbert Corrigan PA 01/14/2015 10:18 AM    [address], [place], [place]  [address] Phone: [phone]; Fax: [phone]     I have seen, examined the patient, and reviewed the above assessment and plan.  On  exam, euvolemic. Changes to above are made where necessary.  Will order echo.  If normal, follow-up with cardiology in 2 months  Co Sign: Thompson Grayer, MD 01/14/2015 4:43 PM

## 2015-01-15 ENCOUNTER — Encounter: Payer: BLUE CROSS/BLUE SHIELD | Admitting: Internal Medicine

## 2015-01-19 ENCOUNTER — Encounter: Payer: Self-pay | Admitting: Physician Assistant

## 2015-01-20 ENCOUNTER — Ambulatory Visit (HOSPITAL_COMMUNITY): Payer: BLUE CROSS/BLUE SHIELD | Attending: Cardiology

## 2015-01-20 ENCOUNTER — Telehealth: Payer: Self-pay | Admitting: *Deleted

## 2015-01-20 ENCOUNTER — Other Ambulatory Visit: Payer: Self-pay

## 2015-01-20 DIAGNOSIS — Z3A34 34 weeks gestation of pregnancy: Secondary | ICD-10-CM | POA: Insufficient documentation

## 2015-01-20 DIAGNOSIS — R0602 Shortness of breath: Secondary | ICD-10-CM | POA: Diagnosis not present

## 2015-01-20 DIAGNOSIS — R42 Dizziness and giddiness: Secondary | ICD-10-CM | POA: Insufficient documentation

## 2015-01-20 NOTE — Telephone Encounter (Signed)
Per Almyra Deforest, PA, called pt to inform her that her ECHO was normal with her EF% being 65-70, no wall motion abnormality or valve abnormality to suggest heart disease.  Pt verbalized appreciation and understanding.

## 2015-01-20 NOTE — Telephone Encounter (Signed)
-----   Message from [place], [place] sent at 01/20/2015  3:15 PM EST ----- Normal Echo, ejection fraction which is representation of pumping function of heart is 65-70% which is within normal range. No wall motion abnormality or valve abnormality to suggest any structural heart disease.

## 2015-01-23 LAB — OB RESULTS CONSOLE GBS: STREP GROUP B AG: NEGATIVE

## 2015-02-23 ENCOUNTER — Inpatient Hospital Stay (HOSPITAL_COMMUNITY)
Admission: AD | Admit: 2015-02-23 | Discharge: 2015-02-23 | Disposition: A | Discharge status: Home / Self Care | Payer: BLUE CROSS/BLUE SHIELD | Source: Ambulatory Visit | Attending: Obstetrics and Gynecology | Admitting: Obstetrics and Gynecology

## 2015-02-23 ENCOUNTER — Encounter (HOSPITAL_COMMUNITY): Payer: Self-pay | Admitting: *Deleted

## 2015-02-23 NOTE — MAU Note (Signed)
Pt reports painful uc's and constant back pain

## 2015-02-23 NOTE — Progress Notes (Signed)
Pt to ambulate for 1 hours. If no cervical change pt can be discharged home w/ BP check in office later today as long as diastolic BP < [id number].

## 2015-02-24 ENCOUNTER — Telehealth (HOSPITAL_COMMUNITY): Payer: Self-pay | Admitting: *Deleted

## 2015-02-24 ENCOUNTER — Encounter (HOSPITAL_COMMUNITY): Payer: Self-pay | Admitting: *Deleted

## 2015-02-24 NOTE — Telephone Encounter (Signed)
Preadmission screen  

## 2015-02-25 ENCOUNTER — Inpatient Hospital Stay (HOSPITAL_COMMUNITY): Payer: BLUE CROSS/BLUE SHIELD | Admitting: Anesthesiology

## 2015-02-25 ENCOUNTER — Encounter (HOSPITAL_COMMUNITY): Payer: Self-pay

## 2015-02-25 ENCOUNTER — Inpatient Hospital Stay (HOSPITAL_COMMUNITY)
Admission: AD | Admit: 2015-02-25 | Discharge: 2015-02-26 | DRG: 775 | Disposition: A | Discharge status: Home / Self Care | Payer: BLUE CROSS/BLUE SHIELD | Source: Ambulatory Visit | Attending: Obstetrics and Gynecology | Admitting: Obstetrics and Gynecology

## 2015-02-25 DIAGNOSIS — Z3A39 39 weeks gestation of pregnancy: Secondary | ICD-10-CM | POA: Diagnosis not present

## 2015-02-25 DIAGNOSIS — Z87891 Personal history of nicotine dependence: Secondary | ICD-10-CM | POA: Diagnosis not present

## 2015-02-25 DIAGNOSIS — O26893 Other specified pregnancy related conditions, third trimester: Secondary | ICD-10-CM | POA: Diagnosis present

## 2015-02-25 DIAGNOSIS — Z6791 Unspecified blood type, Rh negative: Secondary | ICD-10-CM

## 2015-02-25 DIAGNOSIS — Z823 Family history of stroke: Secondary | ICD-10-CM | POA: Diagnosis not present

## 2015-02-25 DIAGNOSIS — Z8249 Family history of ischemic heart disease and other diseases of the circulatory system: Secondary | ICD-10-CM

## 2015-02-25 LAB — COMPREHENSIVE METABOLIC PANEL
ALT: 12 U/L — AB (ref 14–54)
AST: 18 U/L (ref 15–41)
Albumin: 3.1 g/dL — ABNORMAL LOW (ref 3.5–5.0)
Alkaline Phosphatase: 189 U/L — ABNORMAL HIGH (ref 38–126)
Anion gap: 11 (ref 5–15)
BILIRUBIN TOTAL: 0.7 mg/dL (ref 0.3–1.2)
BUN: 9 mg/dL (ref 6–20)
CALCIUM: 9.4 mg/dL (ref 8.9–10.3)
CHLORIDE: 106 mmol/L (ref 101–111)
CO2: 23 mmol/L (ref 22–32)
CREATININE: 0.55 mg/dL (ref 0.44–1.00)
Glucose, Bld: 86 mg/dL (ref 65–99)
Potassium: 3.9 mmol/L (ref 3.5–5.1)
Sodium: 140 mmol/L (ref 135–145)
TOTAL PROTEIN: 7.3 g/dL (ref 6.5–8.1)

## 2015-02-25 LAB — PROTEIN / CREATININE RATIO, URINE
CREATININE, URINE: 79 mg/dL
Protein Creatinine Ratio: 0.48 mg/mg{Cre} — ABNORMAL HIGH (ref 0.00–0.15)
TOTAL PROTEIN, URINE: 38 mg/dL

## 2015-02-25 LAB — CBC
HEMATOCRIT: 34.1 % — AB (ref 36.0–46.0)
Hemoglobin: 11 g/dL — ABNORMAL LOW (ref 12.0–15.0)
MCH: 27.8 pg (ref 26.0–34.0)
MCHC: 32.3 g/dL (ref 30.0–36.0)
MCV: 86.1 fL (ref 78.0–100.0)
PLATELETS: 274 10*3/uL (ref 150–400)
RBC: 3.96 MIL/uL (ref 3.87–5.11)
RDW: 14.8 % (ref 11.5–15.5)
WBC: 16.4 10*3/uL — ABNORMAL HIGH (ref 4.0–10.5)

## 2015-02-25 LAB — RPR: RPR Ser Ql: NONREACTIVE

## 2015-02-25 LAB — TYPE AND SCREEN
ABO/RH(D): O NEG
Antibody Screen: NEGATIVE

## 2015-02-25 MED ORDER — OXYCODONE HCL 5 MG PO TABS: 10.0000 mg | ORAL_TABLET | ORAL | Status: DC | PRN

## 2015-02-25 MED ORDER — PHENYLEPHRINE 40 MCG/ML (10ML) SYRINGE FOR IV PUSH (FOR BLOOD PRESSURE SUPPORT)
PREFILLED_SYRINGE | INTRAVENOUS | Status: AC
  Filled 2015-02-25: qty 20

## 2015-02-25 MED ORDER — OXYCODONE-ACETAMINOPHEN 5-325 MG PO TABS: 2.0000 | ORAL_TABLET | ORAL | Status: DC | PRN

## 2015-02-25 MED ORDER — OXYTOCIN BOLUS FROM INFUSION
500.0000 mL | INTRAVENOUS | Status: DC
Start: 2015-02-25 — End: 2015-02-25

## 2015-02-25 MED ORDER — PRENATAL MULTIVITAMIN CH
1.0000 | ORAL_TABLET | Freq: Every day | ORAL | Status: DC
  Administered 2015-02-25 – 2015-02-26 (×2): 1 via ORAL
  Filled 2015-02-25 (×2): qty 1

## 2015-02-25 MED ORDER — OXYCODONE HCL 5 MG PO TABS: 5.0000 mg | ORAL_TABLET | ORAL | Status: DC | PRN

## 2015-02-25 MED ORDER — BUPIVACAINE HCL (PF) 0.25 % IJ SOLN
INTRAMUSCULAR | Status: DC | PRN
  Administered 2015-02-25: 5 mL via EPIDURAL

## 2015-02-25 MED ORDER — DIPHENHYDRAMINE HCL 25 MG PO CAPS: 25.0000 mg | ORAL_CAPSULE | Freq: Four times a day (QID) | ORAL | Status: DC | PRN

## 2015-02-25 MED ORDER — PHENYLEPHRINE 40 MCG/ML (10ML) SYRINGE FOR IV PUSH (FOR BLOOD PRESSURE SUPPORT)
80.0000 ug | PREFILLED_SYRINGE | INTRAVENOUS | Status: DC | PRN
  Filled 2015-02-25: qty 2

## 2015-02-25 MED ORDER — ZOLPIDEM TARTRATE 5 MG PO TABS: 5.0000 mg | ORAL_TABLET | Freq: Every evening | ORAL | Status: DC | PRN

## 2015-02-25 MED ORDER — FLEET ENEMA 7-19 GM/118ML RE ENEM: 1.0000 | ENEMA | RECTAL | Status: DC | PRN

## 2015-02-25 MED ORDER — LANOLIN HYDROUS EX OINT: TOPICAL_OINTMENT | CUTANEOUS | Status: DC | PRN

## 2015-02-25 MED ORDER — LACTATED RINGERS IV SOLN
500.0000 mL | Freq: Once | INTRAVENOUS | Status: AC
  Administered 2015-02-25: 500 mL via INTRAVENOUS

## 2015-02-25 MED ORDER — TETANUS-DIPHTH-ACELL PERTUSSIS 5-2.5-18.5 LF-MCG/0.5 IM SUSP
0.5000 mL | Freq: Once | INTRAMUSCULAR | Status: AC
  Administered 2015-02-26: 0.5 mL via INTRAMUSCULAR

## 2015-02-25 MED ORDER — BENZOCAINE-MENTHOL 20-0.5 % EX AERO
1.0000 "application " | INHALATION_SPRAY | CUTANEOUS | Status: DC | PRN
  Administered 2015-02-25 – 2015-02-26 (×3): 1 via TOPICAL
  Filled 2015-02-25 (×2): qty 56

## 2015-02-25 MED ORDER — DIBUCAINE 1 % RE OINT
1.0000 | TOPICAL_OINTMENT | RECTAL | Status: DC | PRN
Start: 2015-02-25 — End: 2015-02-26

## 2015-02-25 MED ORDER — OXYTOCIN 10 UNIT/ML IJ SOLN
2.5000 [IU]/h | INTRAVENOUS | Status: DC
  Administered 2015-02-25: 08:00:00 via INTRAVENOUS
  Filled 2015-02-25: qty 4

## 2015-02-25 MED ORDER — FENTANYL 2.5 MCG/ML BUPIVACAINE 1/10 % EPIDURAL INFUSION (WH - ANES)
14.0000 mL/h | INTRAMUSCULAR | Status: DC | PRN
  Administered 2015-02-25: 03:00:00 via EPIDURAL
  Administered 2015-02-25 (×2): 14 mL/h via EPIDURAL
  Filled 2015-02-25: qty 125

## 2015-02-25 MED ORDER — SENNOSIDES-DOCUSATE SODIUM 8.6-50 MG PO TABS
2.0000 | ORAL_TABLET | ORAL | Status: DC
  Administered 2015-02-25: 2 via ORAL
  Filled 2015-02-25: qty 2

## 2015-02-25 MED ORDER — ONDANSETRON HCL 4 MG/2ML IJ SOLN: 4.0000 mg | Freq: Four times a day (QID) | INTRAMUSCULAR | Status: DC | PRN

## 2015-02-25 MED ORDER — METHYLERGONOVINE MALEATE 0.2 MG PO TABS: 0.2000 mg | ORAL_TABLET | ORAL | Status: DC | PRN

## 2015-02-25 MED ORDER — SIMETHICONE 80 MG PO CHEW
80.0000 mg | CHEWABLE_TABLET | ORAL | Status: DC | PRN
  Administered 2015-02-26: 80 mg via ORAL

## 2015-02-25 MED ORDER — MEASLES, MUMPS & RUBELLA VAC ~~LOC~~ INJ
0.5000 mL | INJECTION | Freq: Once | SUBCUTANEOUS | Status: DC
  Filled 2015-02-25: qty 0.5

## 2015-02-25 MED ORDER — MAGNESIUM HYDROXIDE 400 MG/5ML PO SUSP: 30.0000 mL | ORAL | Status: DC | PRN

## 2015-02-25 MED ORDER — PANTOPRAZOLE SODIUM 40 MG PO TBEC
40.0000 mg | DELAYED_RELEASE_TABLET | Freq: Every day | ORAL | Status: DC
  Administered 2015-02-25: 40 mg via ORAL
  Filled 2015-02-25: qty 1

## 2015-02-25 MED ORDER — LIDOCAINE HCL (PF) 1 % IJ SOLN
INTRAMUSCULAR | Status: DC | PRN
  Administered 2015-02-25: 4 mL via EPIDURAL
  Administered 2015-02-25: 2 mL via EPIDURAL
  Administered 2015-02-25: 4 mL via EPIDURAL

## 2015-02-25 MED ORDER — ACETAMINOPHEN 325 MG PO TABS
650.0000 mg | ORAL_TABLET | ORAL | Status: DC | PRN
Start: 2015-02-25 — End: 2015-02-25

## 2015-02-25 MED ORDER — EPHEDRINE 5 MG/ML INJ
10.0000 mg | INTRAVENOUS | Status: DC | PRN
  Filled 2015-02-25: qty 2

## 2015-02-25 MED ORDER — WITCH HAZEL-GLYCERIN EX PADS: 1.0000 "application " | MEDICATED_PAD | CUTANEOUS | Status: DC | PRN

## 2015-02-25 MED ORDER — FENTANYL 2.5 MCG/ML BUPIVACAINE 1/10 % EPIDURAL INFUSION (WH - ANES)
INTRAMUSCULAR | Status: AC
  Filled 2015-02-25: qty 125

## 2015-02-25 MED ORDER — METHYLERGONOVINE MALEATE 0.2 MG/ML IJ SOLN: 0.2000 mg | INTRAMUSCULAR | Status: DC | PRN

## 2015-02-25 MED ORDER — ACETAMINOPHEN 325 MG PO TABS: 650.0000 mg | ORAL_TABLET | ORAL | Status: DC | PRN

## 2015-02-25 MED ORDER — EPHEDRINE 5 MG/ML INJ
10.0000 mg | INTRAVENOUS | Status: DC | PRN
Start: 2015-02-25 — End: 2015-02-25
  Filled 2015-02-25: qty 2

## 2015-02-25 MED ORDER — ONDANSETRON HCL 4 MG PO TABS: 4.0000 mg | ORAL_TABLET | ORAL | Status: DC | PRN

## 2015-02-25 MED ORDER — OXYCODONE-ACETAMINOPHEN 5-325 MG PO TABS: 1.0000 | ORAL_TABLET | ORAL | Status: DC | PRN

## 2015-02-25 MED ORDER — CITRIC ACID-SODIUM CITRATE 334-500 MG/5ML PO SOLN: 30.0000 mL | ORAL | Status: DC | PRN

## 2015-02-25 MED ORDER — ONDANSETRON HCL 4 MG/2ML IJ SOLN
4.0000 mg | INTRAMUSCULAR | Status: DC | PRN
Start: 2015-02-25 — End: 2015-02-26

## 2015-02-25 MED ORDER — LIDOCAINE HCL (PF) 1 % IJ SOLN
30.0000 mL | INTRAMUSCULAR | Status: DC | PRN
  Filled 2015-02-25: qty 30

## 2015-02-25 MED ORDER — LACTATED RINGERS IV SOLN
INTRAVENOUS | Status: DC
Start: 2015-02-25 — End: 2015-02-25
  Administered 2015-02-25 (×2): via INTRAVENOUS

## 2015-02-25 MED ORDER — LACTATED RINGERS IV SOLN: 500.0000 mL | INTRAVENOUS | Status: DC | PRN

## 2015-02-25 MED ORDER — DIPHENHYDRAMINE HCL 50 MG/ML IJ SOLN
12.5000 mg | INTRAMUSCULAR | Status: DC | PRN
  Administered 2015-02-25: 12.5 mg via INTRAVENOUS
  Filled 2015-02-25: qty 1

## 2015-02-25 MED ORDER — IBUPROFEN 600 MG PO TABS
600.0000 mg | ORAL_TABLET | Freq: Four times a day (QID) | ORAL | Status: DC
  Administered 2015-02-25 – 2015-02-26 (×5): 600 mg via ORAL
  Filled 2015-02-25 (×5): qty 1

## 2015-02-25 NOTE — H&P (Signed)
Laurie [name] is a 35 y.o. female G2P1001 at 72 6/7 weeks (EDD 02/26/15 by LMP c/w 8 week [place]) presenting for regular contractions to MAU.  Pt presented at about midnight and cervix was unchanged at 3cm but contractions painful so observed x 2 hours and changed to 5cm and admitted.  BP elevated on admission to 140/90's and has been running a bit higher the last few weeks of pregnancy.  Douglas labs done on admission and blood work WNL but protein/creatinine ration slightly elevated at 0.48.  No PIH sx.   Pt conceived on femara and ovridel. Preanatal care complicated by first child with an ASD with a normal fetal ECHO this pregnancy.  She is O negative and received rhogam.  She had a cardiology consult in the thrid trimester for tachycardia and SOB.   Maternal Medical History:  Reason for admission: Contractions.   Contractions: Onset was 3-5 hours ago.   Frequency: regular.   Perceived severity is moderate.    Fetal activity: Perceived fetal activity is normal.    Prenatal Complications - Diabetes: none.    OB History    Gravida Para Term Preterm AB TAB SAB Ectopic Multiple Living   2 1 1       1     2014 NSVD &36oz  Past Medical History  Diagnosis Date  . Family history of adverse reaction to anesthesia     states twin sister is hard to arouse post-op  . Plantar fasciitis of right foot 02/2014  . 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 Infertility associated with anovulation   . Dysrhythmia     PVCs in grad school and with pregnancy none recently   Past Surgical History  Procedure Laterality Date  . Wisdom tooth extraction    . Ercp  2009    with sphincterotomy  . Carpal tunnel release Left 10/04/2012    Procedure: CARPAL TUNNEL RELEASE;  Surgeon: Tennis Must, MD;  Location: [facility];  Service: Orthopedics;  Laterality: Left;  Marland Kitchen Mass excision Left 12/18/2012    Procedure: EXCISION OF LEFT POSTERIOR  SHOULDER DERMATOFIBROMA;  Surgeon: Gwenyth Ober, MD;   Location: [facility];  Service: General;  Laterality: Left;  . Carpal tunnel release Right 12/24/2012    Procedure: RIGHT CARPAL TUNNEL RELEASE;  Surgeon: Tennis Must, MD;  Location: [facility];  Service: Orthopedics;  Laterality: Right;  . Plantar fascia release Right 02/13/2014    Procedure: RIGHT PLANTAR FASCIA RELEASE;  Surgeon: Wylene Simmer, MD;  Location: [facility];  Service: Orthopedics;  Laterality: Right;  . Gastroc recession extremity Right 02/13/2014    Procedure: RIGHT GASTROCNEMIUS RECESSION;  Surgeon: Wylene Simmer, MD;  Location: [facility];  Service: Orthopedics;  Laterality: Right;  . Pancreatic stent  2009    pancreatitis   Family History: family history includes COPD in her maternal grandmother; Hyperlipidemia in her father; Hypertension in her mother; Stroke in her maternal grandfather. There is no history of Heart attack. Social History:  reports that she has quit smoking. She has never used smokeless tobacco. She reports that she does not drink alcohol or use illicit drugs.   Prenatal Transfer Tool  Maternal Diabetes: No Genetic Screening: Normal Maternal Ultrasounds/Referrals: Normal Fetal Ultrasounds or other Referrals:  Fetal echo  WNL Maternal Substance Abuse:  No Significant Maternal Medications:  None Significant Maternal Lab Results:  Lab values include: Rh negative Other Comments:  None  Review of  Systems  Gastrointestinal: Positive for abdominal pain.    Dilation: 5 Effacement (%): 80 Station: -2 Exam by:: Glenford Peers RNC Blood pressure 156/63, pulse 104, temperature 98.3 F (36.8 C), temperature source Oral, resp. rate 20, height 5\' 6"  (1.676 m), weight 91.173 kg (201 lb), last menstrual period 01/15/2014, SpO2 100 %. Maternal Exam:  Uterine Assessment: Contraction strength is moderate.  Contraction frequency is regular.   Abdomen: Patient reports no abdominal tenderness. Fetal  presentation: vertex  Introitus: Normal vulva. Normal vagina.    Physical Exam  Constitutional: She appears well-developed.  Cardiovascular: Regular rhythm.   Respiratory: Effort normal.  GI: Soft.  Genitourinary: Vagina normal.  Neurological: She is alert.  Psychiatric: She has a normal mood and affect.    Prenatal labs: ABO, Rh: --/--/O NEG (02/22 0116) Antibody: NEG (02/22 0116) Rubella: Immune (07/29 0000) RPR: Nonreactive (07/29 0000)  HBsAg: Negative (07/29 0000)  HIV: Non-reactive (07/29 0000)  GBS: Negative (01/20 0000)  First trimester screen WNL One hour GCT 126  Assessment/Plan: Pt admitted with cervical change and epidural ordered per her request. Will follow BP for possible mild preeclampsia and consider MgS04 if in severe range.   Logan Bores 02/25/2015, 3:22 AM

## 2015-02-25 NOTE — Anesthesia Procedure Notes (Signed)
Epidural Patient location during procedure: OB  Staffing Anesthesiologist: Jyair Kiraly Performed by: anesthesiologist   Preanesthetic Checklist Completed: patient identified, site marked, surgical consent, pre-op evaluation, timeout performed, IV checked, risks and benefits discussed and monitors and equipment checked  Epidural Patient position: sitting Prep: site prepped and draped and DuraPrep Patient monitoring: continuous pulse ox and blood pressure Approach: midline Location: L3-L4 Injection technique: LOR saline  Needle:  Needle type: Tuohy  Needle gauge: 17 G Needle length: 9 cm and 9 Needle insertion depth: 6 cm Catheter type: closed end flexible Catheter size: 19 Gauge Catheter at skin depth: 10 and 10.5 cm Test dose: negative  Assessment Events: blood not aspirated, injection not painful, no injection resistance, negative IV test and no paresthesia  Additional Notes Patient identified. Risks/Benefits/Options discussed with patient including but not limited to bleeding, infection, nerve damage, paralysis, failed block, incomplete pain control, headache, blood pressure changes, nausea, vomiting, reactions to medications, itching and postpartum back pain. Confirmed with bedside nurse the patient's most recent platelet count. Confirmed with patient that they are not currently taking any anticoagulation, have any bleeding history or any family history of bleeding disorders. Patient expressed understanding and wished to proceed. All questions were answered. Sterile technique was used throughout the entire procedure. Please see nursing notes for vital signs. Initially blood was aspirated x3 so catheter removed and epidural space reapproached from different lumbar level with rapid success. Test dose was given through epidural catheter and negative prior to continuing to dose epidural or start infusion. Warning signs of high block given to the patient including shortness of  breath, tingling/numbness in hands, complete motor block, or any concerning symptoms with instructions to call for help. Patient was given instructions on fall risk and not to get out of bed. All questions and concerns addressed with instructions to call with any issues or inadequate analgesia.

## 2015-02-25 NOTE — Progress Notes (Signed)
Patient ID: Laurie [name], female   DOB: 04-11-1980, 35 y.o.   MRN: [id number] Pt received epidural and comfortable BP 130/80's now FHR Category 1  Cervix 80/5/-1 AROM clear  Follow progress

## 2015-02-25 NOTE — MAU Note (Signed)
Worsening contractions.

## 2015-02-25 NOTE — Progress Notes (Signed)
Comfortable with epidural Afeb, VSS, BP 140/90 FHT- Cat I VE-C/C/+1 Start pushing, anticipate SVD

## 2015-02-25 NOTE — Lactation Note (Signed)
This note was copied from a baby's chart. Lactation Consultation Note  Patient Name: Laurie [name] [id number] Date: 02/25/2015 Reason for consult: Initial assessment Baby at 10 hr of life and mom thinks baby is latching well. Mom stated that her 2.34 yr old daughter was not that interested in bf or latching. She lost over a pound and was referred to lactation. It was discovered at a consult that the baby was only taking in 0.5oz of breast milk in hour of latching. Mom started an SNS and aggressive pumping. It all became too much so she stopped. Mom does have small (almost under developed looking) wide spaced cone shaped breast with a long nipple and large areola. Demonstrated manual expression, colostrum expressed easily bilaterally, spoon in room. Given Harmony to post pump after latching for extra stimulation. Mom is ok with offering formula if there becomes and issue with supply. Discussed baby behavior, feeding frequency, baby belly size, voids, wt loss, breast changes, and nipple care. Given lactation handouts. Aware of OP services and support group.      Maternal Data Has patient been taught Hand Expression?: Yes Does the patient have breastfeeding experience prior to this delivery?: Yes  Feeding Feeding Type: Breast Fed Length of feed: 20 min  LATCH Score/Interventions Latch: Grasps breast easily, tongue down, lips flanged, rhythmical sucking.  Audible Swallowing: Spontaneous and intermittent  Type of Nipple: Everted at rest and after stimulation  Comfort (Breast/Nipple): Soft / non-tender     Hold (Positioning): Assistance needed to correctly position infant at breast and maintain latch.  LATCH Score: 9  Lactation Tools Discussed/Used WIC Program: No Pump Review: Setup, frequency, and cleaning;Milk Storage Initiated by:: ES Date initiated:: 02/25/15   Consult Status Consult Status: Follow-up Date: 02/26/15 Follow-up type: In-patient    ANNALEE SEMO 02/25/2015, 6:30 PM

## 2015-02-25 NOTE — Anesthesia Postprocedure Evaluation (Addendum)
Anesthesia Post Note  Patient: Laurie [name]  Procedure(s) Performed: * No procedures listed *  Patient location during evaluation: Mother Baby Anesthesia Type: Epidural Level of consciousness: awake Pain management: satisfactory to patient Vital Signs Assessment: post-procedure vital signs reviewed and stable Respiratory status: spontaneous breathing Cardiovascular status: stable Anesthetic complications: no Comments: current pain is 0    Last Vitals:  Filed Vitals:   02/25/15 1009 02/25/15 1204  BP: 148/81 138/85  Pulse: 96 90  Temp: 36.9 C 37.2 C  Resp: 20 20    Last Pain:  Filed Vitals:   02/25/15 1206  PainSc: 0-No pain                 Sloan Takagi

## 2015-02-25 NOTE — Progress Notes (Addendum)
Patient in high-fowler's position. [place] adjusted.

## 2015-02-25 NOTE — Progress Notes (Signed)
Patient ID: Laurie [name], female   DOB: 1980-08-05, 35 y.o.   MRN: [id number] Comfortable with epidural afeb vss FHR Category 1 Cervix c/9/0  Will recheck in next hour or with pressure.

## 2015-02-25 NOTE — Progress Notes (Signed)
Notified of pt recheck and cervical dilation. Lab results reviewed. Will admit to labor and delivery with epidural orders. RN to recheck at 4am and call dr Marvel Plan

## 2015-02-25 NOTE — Progress Notes (Signed)
Notified of pt arrival in MAU and complaint as well as elevated BP. Will check labs and offer pt to stay and walk

## 2015-02-25 NOTE — Progress Notes (Signed)
Patient ID: Laurie [name], female   DOB: [birth year], 35 y.o.   MRN: [id number] Pt comfortable with epidural FHR Category 1  Cervix c/9/0  At 0630  Dr. Willis Modena on way in and will check pt when arrives

## 2015-02-25 NOTE — Anesthesia Preprocedure Evaluation (Signed)
Anesthesia Evaluation  Patient identified by MRN, date of birth, ID band Patient awake    Reviewed: Allergy & Precautions, NPO status , Patient's Chart, lab work & pertinent test results  Airway Mallampati: I  TM Distance: >3 FB Neck ROM: Full    Dental  (+) Teeth Intact, Dental Advisory Given   Pulmonary former smoker,    breath sounds clear to auscultation       Cardiovascular  Rhythm:Regular Rate:Normal     Neuro/Psych    GI/Hepatic   Endo/Other    Renal/GU      Musculoskeletal   Abdominal   Peds  Hematology   Anesthesia Other Findings Pregnancy - uncomplicated  Previous epidural worked well but she does have increased requirement for local anesthetics she reports  Platelets and allergies reviewed Denies active cardiac or pulmonary symptoms, METS > 4  Denies blood thinning medications, bleeding disorders, hypertension, asthma, supine hypotension syndrome, previous anesthesia difficulties    Reproductive/Obstetrics (+) Pregnancy                             Anesthesia Physical  Anesthesia Plan  ASA: II  Anesthesia Plan: Epidural   Post-op Pain Management:    Induction:   Airway Management Planned:   Additional Equipment:   Intra-op Plan:   Post-operative Plan:   Informed Consent: I have reviewed the patients History and Physical, chart, labs and discussed the procedure including the risks, benefits and alternatives for the proposed anesthesia with the patient or authorized representative who has indicated his/her understanding and acceptance.     Plan Discussed with: CRNA and Anesthesiologist  Anesthesia Plan Comments:         Anesthesia Quick Evaluation

## 2015-02-26 ENCOUNTER — Ambulatory Visit: Payer: BLUE CROSS/BLUE SHIELD | Admitting: Internal Medicine

## 2015-02-26 MED ORDER — RHO D IMMUNE GLOBULIN 1500 UNIT/2ML IJ SOSY
300.0000 ug | PREFILLED_SYRINGE | Freq: Once | INTRAMUSCULAR | Status: AC
  Administered 2015-02-26: 300 ug via INTRAVENOUS
  Filled 2015-02-26: qty 2

## 2015-02-26 NOTE — Lactation Note (Signed)
This note was copied from a baby's chart. Lactation Consultation Note  Patient Name: Laurie [name] [id number] Date: 02/26/2015 Reason for consult: Follow-up assessment Mom reports this baby is nursing well. Cluster fed during the night and this am, now asleep. Mom's 1st baby did not latch well or transfer well, Mom milk supply decreased. Mom concerned about history but very happy this baby seems to be doing better. Advised baby should be at the breast 8-12 times or more in 24 hours, nursing at least 15-20 minutes both breasts when possible. Advised to monitor voids/stools. F/U Peds visit in 2 days. Mom plan is to supplement if needed if baby not satisfied at the breast. Advised if supplement needed then post pump for 15 minutes and try to give EBM, but Mom reports she will use formula if needed. OP f/u with lactation scheduled for Thursday, 03/05/15 at 0900 for pre/post weight check due to history. Mom asked about supplements, advised Lactation Support by Thomas Hoff or More Milk Plus by [employer]. Engorgement care reviewed if needed. Encouraged to come to support groups. Encouraged to call for questions/concerns. Offered to observe latch before d/c home, Mom declined.   Maternal Data    Feeding Feeding Type: Breast Fed Length of feed: 20 min  LATCH Score/Interventions                      Lactation Tools Discussed/Used     Consult Status Consult Status: Complete Date: 02/26/15 Follow-up type: In-patient    Katrine Coho 02/26/2015, 12:12 PM

## 2015-02-26 NOTE — Progress Notes (Signed)
PPD #1 No problems, wants to go home Afeb, VSS, BP 120-150/70-90-mostly normal Fundus firm, NT at U-1 Continue routine postpartum care, d/c home today

## 2015-02-26 NOTE — Discharge Instructions (Signed)
As per discharge pamphlet °

## 2015-02-26 NOTE — Discharge Summary (Signed)
OB Discharge Summary     Patient Name: Laurie [name] DOB: April 06, 1980 MRN: [id number]  Date of admission: 02/25/2015 Delivering MD: Cheri Fowler   Date of discharge: 02/26/2015  Admitting diagnosis: 39 WEEKS CTX Intrauterine pregnancy: [redacted]w[redacted]d     Secondary diagnosis:  Active Problems:   Normal labor and delivery      Discharge diagnosis: Term Pregnancy Delivered                                                                                                Augmentation: AROM and Pitocin  Complications: None  Hospital course:  Onset of Labor With Vaginal Delivery     35 y.o. yo [id number] at [redacted]w[redacted]d was admitted in Active Labor on 02/25/2015. Patient had an uncomplicated labor course as follows:  Membrane Rupture Time/Date: 4:00 AM ,02/25/2015   Intrapartum Procedures: Episiotomy: None [1]                                         Lacerations:  2nd degree [3]  Patient had a delivery of a Viable infant. 02/25/2015  Information for the patient's newborn:  Eisha, Conrath Girl Trilby [id number]  Delivery Method: Vaginal, Spontaneous Delivery (Filed from Delivery Summary)    Pateint had an uncomplicated postpartum course.  She is ambulating, tolerating a regular diet, passing flatus, and urinating well. Patient is discharged home in stable condition on 02/26/2015.    Physical exam  Filed Vitals:   02/25/15 1448 02/25/15 1820 02/25/15 2331 02/26/15 0632  BP: 143/91 140/83 134/76 146/96  Pulse: 87 88 86 93  Temp: 98.3 F (36.8 C) 98.4 F (36.9 C) 97.4 F (36.3 C) 97.8 F (36.6 C)  TempSrc: Oral Oral Oral Oral  Resp:  18 18 18   Height:      Weight:      SpO2: 98%  98% 100%   General: alert Lochia: appropriate Uterine Fundus: firm  Labs: Lab Results  Component Value Date   WBC 16.4* 02/25/2015   HGB 11.0* 02/25/2015   HCT 34.1* 02/25/2015   MCV 86.1 02/25/2015   PLT 274 02/25/2015   CMP Latest Ref Rng 02/25/2015  Glucose 65 - 99 mg/dL 86  BUN 6 - 20 mg/dL 9   Creatinine 0.44 - 1.00 mg/dL 0.55  Sodium 135 - 145 mmol/L 140  Potassium 3.5 - 5.1 mmol/L 3.9  Chloride 101 - 111 mmol/L 106  CO2 22 - 32 mmol/L 23  Calcium 8.9 - 10.3 mg/dL 9.4  Total Protein 6.5 - 8.1 g/dL 7.3  Total Bilirubin 0.3 - 1.2 mg/dL 0.7  Alkaline Phos 38 - 126 U/L 189(H)  AST 15 - 41 U/L 18  ALT 14 - 54 U/L 12(L)    Discharge instruction: per After Visit Summary and "Baby and Me Booklet".  After visit meds:    Medication List    TAKE these medications        pantoprazole 40 MG tablet  Commonly known as:  PROTONIX  Take 40 mg by mouth daily.  Diet: routine diet  Activity: Advance as tolerated. Pelvic rest for 6 weeks.   Outpatient follow up:6 weeks   Newborn Data: Live born female  Birth Weight: 9 lb 8.7 oz (4330 g) APGAR: 9, 9  Baby Feeding: Breast Disposition:home with mother   02/26/2015 Clarene Duke, MD

## 2015-02-27 LAB — RH IG WORKUP (INCLUDES ABO/RH)
ABO/RH(D): O NEG
FETAL SCREEN: NEGATIVE
GESTATIONAL AGE(WKS): 39.6
Unit division: 0

## 2015-03-04 ENCOUNTER — Inpatient Hospital Stay (HOSPITAL_COMMUNITY): Admission: RE | Admit: 2015-03-04 | Payer: BLUE CROSS/BLUE SHIELD | Source: Ambulatory Visit

## 2015-03-05 ENCOUNTER — Ambulatory Visit (HOSPITAL_COMMUNITY)
Admit: 2015-03-05 | Discharge: 2015-03-05 | Disposition: A | Discharge status: Home / Self Care | Payer: BLUE CROSS/BLUE SHIELD | Attending: Obstetrics and Gynecology | Admitting: Obstetrics and Gynecology

## 2015-03-05 NOTE — Lactation Note (Signed)
Lactation Consult; Weight today 9- 6.9 oz 4278 g. Mom here today to see how much baby is getting from her. Reports baby latches well and nurses for 20-25 min but still acts hungry after every feeding. Has been supplementing with formula after every feeding. Mom has history of low milk supply with first baby. Reports she did "aggressive" pumping and supplements with her first and she was never able to pump more than 1/2 oz at a time. States she is not going to do that this time, Is using manual pump 2 times a day and getting a few mls. Mom's breasts are small and wide spaced. Reports breast size has not changed since before pregnancy. Reports they felt a little fuller on Saturday and she is able to hand express whitish milk. Offered SNS but mom states she does not want to use it again- will just bottle feed. Encouragement given that baby is getting the benefits of some breast milk  from her. No further questions at present To call prn.  Mother's reason for visit:  Wants to do pre/post weight check to see how much baby is getting Visit Type:  Feeding assessment Appointment Notes:  Very low milk supply with first baby, weight loss > 1 lb, baby lethargic and did not nurse well Consult:  Initial Lactation Consultant:  Truddie Crumble  ____ Baby's Name: Laurie [name] Date of Birth: 02/25/2014 Pediatrician: Rosana Hoes Gender: female Gestational Age: [redacted]w[redacted]d (At Birth) Birth Weight: 9 lb 8.7 oz (4330 g) Weight at Discharge: Weight: 9 lb 5 oz (4224 g) (# 4)Date of Discharge: 02/26/2015 [facility] Weights   02/25/15 0805 02/25/15 2340  Weight: 9 lb 8.7 oz (4330 g) 9 lb 5 oz (4224 g)     ____________________________________________________________________    ________________________________________________________________________  Mother's Name: Laurie [name] Breastfeeding Experience:  P2, low milk supply with first, only nursed for a  month    ________________________________________________________________________  Breastfeeding History (Post Discharge)  Frequency of breastfeeding:  q 2-4 hr Duration of feeding:  20-25 min  Supplementation  Formula:  Volume 2-3 oz Frequency:  q feeding       Breastmilk:  Volume few ml Frequency:  As available  Method:  Bottle,   Pumping  Type of pump:  Manual Frequency:  2 times/day Volume:  Few ml  Infant Intake and Output Assessment  Voids:  8+ in 24 hrs.  Color:  Clear yellow Stools:  8+ in 24 hrs.  Color:  Brown and Yellow  ________________________________________________________________________  Maternal Breast Assessment  Breast:  Soft, Widely spaced and very small, with no changes during pregnancy Nipple:  Erect  _______________________________________________________________________ Feeding Assessment/Evaluation  Initial feeding assessment:   Positioning:  Cradle and Cross cradle   LATCH documentation:  Latch:  2 = Grasps breast easily, tongue down, lips flanged, rhythmical sucking.  Audible swallowing:  0 = None  Type of nipple:  2 = Everted at rest and after stimulation  Comfort (Breast/Nipple):  2 = Soft / non-tender  Hold (Positioning):  2 = No assistance needed to correctly position infant at breast  LATCH score:  8  Attached assessment:  Deep  Lips flanged:  Yes.    Lips untucked:  No.   Pre-feed weight:  4278 g 9-6.9 oz Post-feed weight:  4280 g 9-7 Amount transferred:  2 ml Amount supplemented:  90 ml  Total amount transferred:  2 ml Total supplement given:  90 ml

## 2016-01-04 DIAGNOSIS — K5190 Ulcerative colitis, unspecified, without complications: Secondary | ICD-10-CM

## 2016-01-04 HISTORY — DX: Ulcerative colitis, unspecified, without complications: K51.90

## 2017-12-16 ENCOUNTER — Encounter (HOSPITAL_COMMUNITY): Payer: Self-pay | Admitting: Emergency Medicine

## 2017-12-16 ENCOUNTER — Ambulatory Visit (HOSPITAL_COMMUNITY)
Admission: EM | Admit: 2017-12-16 | Discharge: 2017-12-16 | Disposition: A | Discharge status: Home / Self Care | Payer: BLUE CROSS/BLUE SHIELD

## 2017-12-16 DIAGNOSIS — J020 Streptococcal pharyngitis: Secondary | ICD-10-CM | POA: Insufficient documentation

## 2017-12-16 LAB — POCT RAPID STREP A: Streptococcus, Group A Screen (Direct): POSITIVE — AB

## 2017-12-16 MED ORDER — AMOXICILLIN 500 MG PO CAPS: 500.0000 mg | ORAL_CAPSULE | Freq: Three times a day (TID) | ORAL | 0 refills | Status: AC

## 2017-12-16 NOTE — Discharge Instructions (Signed)

## 2017-12-16 NOTE — ED Triage Notes (Signed)
Pt c/o achy body, neck pain, sore throat, fever since Friday. 102.2 temp

## 2017-12-17 NOTE — ED Provider Notes (Signed)
[place]    CSN: [id number] Arrival date & time: 12/16/17  1610     History   Chief Complaint Chief Complaint  Patient presents with  . Sore Throat    HPI Laurie [name] is a 37 y.o. female negative past medical history presenting today for evaluation of fever, body aches and sore throat.  Patient states that symptoms began on Thursday, 3 days ago.  Since she has had worsening symptoms, T-max noted up to 102.2.  She has not had any associated cough and congestion.  She initially attributed her symptoms to the flu, but has been concerned about strep given pain in throat.  Patient works in the ED as a Marine scientist.  She has taken ibuprofen with modest relief.  HPI  Past Medical History:  Diagnosis Date  . Dysrhythmia    PVCs in grad school and with pregnancy none recently  . Family history of adverse reaction to anesthesia    states twin sister is hard to arouse post-op  . Infertility associated with anovulation   . Plantar fasciitis of right foot 02/2014  . 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 Normal labor and delivery 02/25/2015  . Dermatofibroma of left shoulder 12/11/2012  . SVD (spontaneous vaginal delivery) 09/11/2012  . Normal pregnancy, first 09/09/2012  . PROM (premature rupture of membranes) 09/09/2012    Past Surgical History:  Procedure Laterality Date  . CARPAL TUNNEL RELEASE Left 10/04/2012   Procedure: CARPAL TUNNEL RELEASE;  Surgeon: Tennis Must, MD;  Location: [place];  Service: Orthopedics;  Laterality: Left;  . CARPAL TUNNEL RELEASE Right 12/24/2012   Procedure: RIGHT CARPAL TUNNEL RELEASE;  Surgeon: Tennis Must, MD;  Location: [place];  Service: Orthopedics;  Laterality: Right;  . ERCP  2009   with sphincterotomy  . GASTROC RECESSION EXTREMITY Right 02/13/2014   Procedure: RIGHT GASTROCNEMIUS RECESSION;  Surgeon: Wylene Simmer, MD;  Location: [place];  Service: Orthopedics;  Laterality: Right;  . MASS EXCISION Left 12/18/2012   Procedure: EXCISION OF LEFT POSTERIOR  SHOULDER DERMATOFIBROMA;  Surgeon: Gwenyth Ober, MD;  Location: [place];  Service: General;  Laterality: Left;  . pancreatic stent  2009   pancreatitis  . PLANTAR FASCIA RELEASE Right 02/13/2014   Procedure: RIGHT PLANTAR FASCIA RELEASE;  Surgeon: Wylene Simmer, MD;  Location: [place];  Service: Orthopedics;  Laterality: Right;  . WISDOM TOOTH EXTRACTION      OB History    Gravida  2   Para  2   Term  2   Preterm      AB      Living  2     SAB      TAB      Ectopic      Multiple  0   Live Births  2            Home Medications    Prior to Admission medications   Medication Sig Start Date End Date Taking? Authorizing Provider  buPROPion (WELLBUTRIN XL) 150 MG 24 hr tablet Take 150 mg by mouth daily.   Yes [provider]  sertraline (ZOLOFT) 50 MG tablet Take 50 mg by mouth daily.   Yes [provider]  amoxicillin (AMOXIL) 500 MG capsule Take 1 capsule (500 mg total) by mouth 3 (three) times daily for 10 days. 12/16/17 12/26/17  Dinari Stgermaine C, PA-C  pantoprazole (PROTONIX) 40 MG tablet Take 40 mg by mouth daily.    [provider]    Family History Family History  Problem Relation Age of Onset  . Hypertension Mother   . Hyperlipidemia Father   . COPD Maternal Grandmother   . Stroke Maternal Grandfather   . Heart attack Neg Hx     Social History Social History   Tobacco Use  . Smoking status: Former Research scientist (life sciences)  . Smokeless tobacco: Never Used  . Tobacco comment: quit in college  Substance Use Topics  . Alcohol use: No    Alcohol/week: 0.0 standard drinks    Comment: occasionally  . Drug use: No     Allergies   Patient has no known allergies.   Review of Systems Review of Systems  Constitutional: Positive for chills, fatigue and fever. Negative  for activity change and appetite change.  HENT: Positive for sore throat. Negative for congestion, ear pain, rhinorrhea, sinus pressure and trouble swallowing.   Eyes: Negative for discharge and redness.  Respiratory: Negative for cough, chest tightness and shortness of breath.   Cardiovascular: Negative for chest pain.  Gastrointestinal: Negative for abdominal pain, diarrhea, nausea and vomiting.  Musculoskeletal: Positive for myalgias.  Skin: Negative for rash.  Neurological: Negative for dizziness, light-headedness and headaches.     Physical Exam Triage Vital Signs ED Triage Vitals [12/16/17 1618]  Enc Vitals Group     BP (!) 143/85     Pulse Rate 82     Resp 16     Temp 98.4 F (36.9 C)     Temp src      SpO2 100 %     Weight      Height      Head Circumference      Peak Flow      Pain Score 5     Pain Loc      Pain Edu?      Excl. in [place]?    No data found.  Updated Vital Signs BP (!) 143/85   Pulse 82   Temp 98.4 F (36.9 C)   Resp 16   LMP 12/02/2017   SpO2 100%   Visual Acuity Right Eye Distance:   Left Eye Distance:   Bilateral Distance:    Right Eye Near:   Left Eye Near:    Bilateral Near:     Physical Exam Vitals signs and nursing note reviewed.  Constitutional:      General: She is not in acute distress.    Appearance: She is well-developed.  HENT:     Head: Normocephalic and atraumatic.     Ears:     Comments: Bilateral ears without tenderness to palpation of external auricle, tragus and mastoid, EAC's without erythema or swelling, TM's with good bony landmarks and cone of light. Non erythematous.    Mouth/Throat:     Comments: Oral mucosa pink and moist, moderate tonsillar enlargement with significant erythema, posterior pharynx patent and erythematous, no uvula deviation or swelling. Normal phonation Eyes:     Conjunctiva/sclera: Conjunctivae normal.  Neck:     Musculoskeletal: Neck supple.  Cardiovascular:     Rate and Rhythm: Normal  rate and regular rhythm.     Heart sounds: No murmur.  Pulmonary:     Effort: Pulmonary effort is normal. No respiratory distress.     Breath sounds: Normal breath sounds.     Comments: Breathing comfortably at rest, CTABL, no wheezing, rales or  other adventitious sounds auscultated Abdominal:     Palpations: Abdomen is soft.     Tenderness: There is no abdominal tenderness.  Skin:    General: Skin is warm and dry.  Neurological:     Mental Status: She is alert.      UC Treatments / Results  Labs (all labs ordered are listed, but only abnormal results are displayed) Labs Reviewed  POCT RAPID STREP A - Abnormal; Notable for the following components:      Result Value   Streptococcus, Group A Screen (Direct) POSITIVE (*)    All other components within normal limits    EKG None  Radiology No results found.  Procedures Procedures (including critical care time)  Medications Ordered in UC Medications - No data to display  Initial Impression / Assessment and Plan / UC Course  I have reviewed the triage vital signs and the nursing notes.  Pertinent labs & imaging results that were available during my care of the patient were reviewed by me and considered in my medical decision making (see chart for details).     Strep test positive.  Will treat as strep with amoxicillin.  Discussed further symptomatic management as antibiotic begins to work.  Recommendations below.  Continue to monitor symptoms,Discussed strict return precautions. Patient verbalized understanding and is agreeable with plan.  Final Clinical Impressions(s) / UC Diagnoses   Final diagnoses:  Strep throat     Discharge Instructions     Sore Throat  Your rapid strep test was positive today. We will treat you for strep throat with an antibiotic. Please take Amoxicillin as prescribed.   Please continue Tylenol or Ibuprofen for fever and pain. May try salt water gargles, cepacol lozenges, throat spray, or  OTC cold relief medicine for throat discomfort. If you also have congestion take a daily anti-histamine like Zyrtec, Claritin, and a oral decongestant to help with post nasal drip that may be irritating your throat.   Stay hydrated and drink plenty of fluids to keep your throat coated relieve irritation.     ED Prescriptions    Medication Sig Dispense Auth. Provider   amoxicillin (AMOXIL) 500 MG capsule Take 1 capsule (500 mg total) by mouth 3 (three) times daily for 10 days. 30 capsule Farrel Guimond C, PA-C     Controlled Substance Prescriptions [place] Controlled Substance Registry consulted? Not Applicable   Janith Lima, [place] 12/17/17 [phone]

## 2018-08-13 DIAGNOSIS — F418 Other specified anxiety disorders: Secondary | ICD-10-CM | POA: Insufficient documentation

## 2018-08-13 DIAGNOSIS — I10 Essential (primary) hypertension: Secondary | ICD-10-CM | POA: Insufficient documentation

## 2020-04-15 DIAGNOSIS — E6609 Other obesity due to excess calories: Secondary | ICD-10-CM | POA: Insufficient documentation

## 2020-04-15 DIAGNOSIS — Z6830 Body mass index (BMI) 30.0-30.9, adult: Secondary | ICD-10-CM | POA: Insufficient documentation

## 2020-04-15 DIAGNOSIS — F900 Attention-deficit hyperactivity disorder, predominantly inattentive type: Secondary | ICD-10-CM | POA: Insufficient documentation

## 2020-04-15 HISTORY — DX: Other obesity due to excess calories: E66.09

## 2021-03-02 ENCOUNTER — Ambulatory Visit: Payer: 59 | Admitting: Obstetrics & Gynecology

## 2021-03-02 ENCOUNTER — Encounter: Payer: Self-pay | Admitting: Obstetrics & Gynecology

## 2021-03-02 ENCOUNTER — Other Ambulatory Visit (HOSPITAL_COMMUNITY)
Admission: RE | Admit: 2021-03-02 | Discharge: 2021-03-02 | Disposition: A | Discharge status: Home / Self Care | Payer: 59 | Source: Ambulatory Visit | Attending: Obstetrics & Gynecology | Admitting: Obstetrics & Gynecology

## 2021-03-02 ENCOUNTER — Other Ambulatory Visit: Payer: Self-pay

## 2021-03-02 VITALS — BP 150/95 | HR 80 | Ht 66.0 in | Wt 185.0 lb

## 2021-03-02 DIAGNOSIS — E663 Overweight: Secondary | ICD-10-CM | POA: Diagnosis not present

## 2021-03-02 DIAGNOSIS — Z01419 Encounter for gynecological examination (general) (routine) without abnormal findings: Secondary | ICD-10-CM | POA: Diagnosis not present

## 2021-03-02 DIAGNOSIS — Z6829 Body mass index (BMI) 29.0-29.9, adult: Secondary | ICD-10-CM

## 2021-03-02 DIAGNOSIS — I10 Essential (primary) hypertension: Secondary | ICD-10-CM

## 2021-03-02 DIAGNOSIS — Z23 Encounter for immunization: Secondary | ICD-10-CM

## 2021-03-02 MED ORDER — NORETHINDRONE ACET-ETHINYL EST 1-20 MG-MCG PO TABS: 1.0000 | ORAL_TABLET | Freq: Every day | ORAL | 12 refills | Status: DC

## 2021-03-02 NOTE — Progress Notes (Signed)
Patient ID: Laurie [name], female   DOB: 1980/03/14, 41 y.o.   MRN: [id number]  Chief Complaint  Patient presents with   Gynecologic Exam    HPI Laurie [name] is a 41 y.o. female.  Laurie [name] Patient's last menstrual period was 02/16/2021. She has regular menses using Loestrin for [facility] and wishes to continue.  HPIHer migraines have no aura and occur cyclically but about every other month. 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/2014   Ulcerative colitis ([place]) 2018    Past Surgical History:  Procedure Laterality Date   CARPAL TUNNEL RELEASE Left 10/04/2012   Procedure: CARPAL TUNNEL RELEASE;  Surgeon: Tennis Must, MD;  Location: [place];  Service: Orthopedics;  Laterality: Left;   CARPAL TUNNEL RELEASE Right 12/24/2012   Procedure: RIGHT CARPAL TUNNEL RELEASE;  Surgeon: Tennis Must, MD;  Location: [place];  Service: Orthopedics;  Laterality: Right;   ERCP  2009   with sphincterotomy   GASTROC RECESSION EXTREMITY Right 02/13/2014   Procedure: RIGHT GASTROCNEMIUS RECESSION;  Surgeon: Wylene Simmer, MD;  Location: [place];  Service: Orthopedics;  Laterality: Right;   MASS EXCISION Left 12/18/2012   Procedure: EXCISION OF LEFT POSTERIOR  SHOULDER DERMATOFIBROMA;  Surgeon: Gwenyth Ober, MD;  Location: [place];  Service: General;  Laterality: Left;   pancreatic stent  2009   pancreatitis   PLANTAR FASCIA RELEASE Right 02/13/2014   Procedure: RIGHT PLANTAR FASCIA RELEASE;  Surgeon: Wylene Simmer, MD;  Location: [place];  Service: Orthopedics;  Laterality: Right;    WISDOM TOOTH EXTRACTION      Family History  Problem Relation Age of Onset   Heart attack Paternal Grandfather    COPD Maternal Grandmother    Stroke Maternal Grandfather    Hyperlipidemia Father    Heart Problems Father    Hypertension Mother    Breast cancer Mother     Social History Social History   Tobacco Use   Smoking status: Former   Smokeless tobacco: Never   Tobacco comments:    quit in college  Vaping Use   Vaping Use: Never used  Substance Use Topics   Alcohol use: No    Alcohol/week: 0.0 standard drinks    Comment: occasionally   Drug use: No    No Known Allergies  Current Outpatient Medications  Medication Sig Dispense Refill   amphetamine-dextroamphetamine (ADDERALL XR) 15 MG 24 hr capsule Take 15 mg by mouth daily.     buPROPion (WELLBUTRIN XL) 150 MG 24 hr tablet Take 150 mg by mouth daily.     metoprolol succinate (TOPROL-XL) 25 MG 24 hr tablet Take 25 mg by mouth daily.     sertraline (ZOLOFT) 50 MG tablet Take 50 mg by mouth daily.     SUMAtriptan (IMITREX) 25 MG tablet 25 mg as needed.     norethindrone-ethinyl estradiol (LOESTRIN) 1-20 MG-MCG tablet Take 1 tablet by mouth daily. 28 tablet 12   ondansetron (ZOFRAN-ODT) 4 MG disintegrating tablet Take 4 mg by mouth every 8 (eight) hours as needed.     pantoprazole (  PROTONIX) 40 MG tablet Take 40 mg by mouth daily.     spironolactone (ALDACTONE) 25 MG tablet Take 25 mg by mouth 2 (two) times daily.     No current facility-administered medications for this visit.    Review of Systems Review of Systems  Constitutional: Negative.   Respiratory: Negative.    Cardiovascular: Negative.   Genitourinary: Negative.   Musculoskeletal: Negative.   Neurological:  Positive for headaches.   Blood pressure (!) 150/95, pulse 80, height 5\' 6"  (1.676 m), weight 185 lb (83.9 kg), last menstrual period 02/16/2021, unknown if currently breastfeeding.  Physical Exam Physical Exam Vitals and nursing note  reviewed. Exam conducted with a chaperone present.  Constitutional:      Appearance: Normal appearance.  HENT:     Head: Normocephalic and atraumatic.  Cardiovascular:     Rate and Rhythm: Normal rate.     Heart sounds: Normal heart sounds.  Pulmonary:     Effort: Pulmonary effort is normal.     Breath sounds: Normal breath sounds.  Chest:  Breasts:    Right: Normal.     Left: Normal.  Abdominal:     General: Abdomen is flat.     Palpations: Abdomen is soft.  Genitourinary:    General: Normal vulva.     Exam position: Lithotomy position.     Vagina: Normal.     Cervix: Normal.     Uterus: Normal.      Adnexa: Right adnexa normal and left adnexa normal.  Skin:    General: Skin is warm and dry.  Neurological:     General: No focal deficit present.     Mental Status: She is alert.  Psychiatric:        Mood and Affect: Mood normal.        Behavior: Behavior normal.    Data Reviewed Pap 2017  Assessment Well woman exam with routine gynecological exam - Plan: Cytology - PAP, Cervicovaginal ancillary only( [place]), Hepatitis B surface antigen, Hepatitis C antibody, HIV Antibody (routine testing w rflx), RPR, norethindrone-ethinyl estradiol (LOESTRIN) 1-20 MG-MCG tablet, MM DIGITAL SCREENING BILATERAL, HPV 9-valent vaccine,Recombinat, CANCELED: HPV 9-valent vaccine,Recombinat  Hypertension, unspecified type  Overweight with body mass index (BMI) of 29 to 29.9 in adult   Plan Weight loss strategy discussed at length Considering breast augmentation Meds ordered this encounter  Medications   norethindrone-ethinyl estradiol (LOESTRIN) 1-20 MG-MCG tablet    Sig: Take 1 tablet by mouth daily.    Dispense:  28 tablet    Refill:  12   Orders Placed This Encounter  Procedures   MM DIGITAL SCREENING BILATERAL    Standing Status:   Future    Standing Expiration Date:   08/30/2021    Order Specific Question:   Reason for exam:    Answer:   Screening    Order Specific  Question:   Is the patient pregnant?    Answer:   No    Order Specific Question:   Preferred imaging location?    Answer:   [facility]   HPV 9-valent vaccine,Recombinat   Hepatitis B surface antigen   Hepatitis C antibody   HIV Antibody (routine testing w rflx)   RPR       Laurie [name] 03/02/2021, 3:11 PM

## 2021-03-02 NOTE — Progress Notes (Signed)
Last Pap a few years ago. No mammo yet, mom with history breast cancer Trying to loose weight, exercising, concerned for hormonal reasons for weight

## 2021-03-03 ENCOUNTER — Other Ambulatory Visit (HOSPITAL_COMMUNITY): Payer: Self-pay

## 2021-03-03 LAB — CERVICOVAGINAL ANCILLARY ONLY
Chlamydia: NEGATIVE
Comment: NEGATIVE
Comment: NEGATIVE
Comment: NORMAL
Neisseria Gonorrhea: NEGATIVE
Trichomonas: NEGATIVE

## 2021-03-04 ENCOUNTER — Other Ambulatory Visit: Payer: Self-pay | Admitting: Obstetrics & Gynecology

## 2021-03-04 DIAGNOSIS — Z1231 Encounter for screening mammogram for malignant neoplasm of breast: Secondary | ICD-10-CM

## 2021-03-05 LAB — CYTOLOGY - PAP
Adequacy: ABSENT
Comment: NEGATIVE
Diagnosis: NEGATIVE
High risk HPV: NEGATIVE

## 2021-03-08 ENCOUNTER — Other Ambulatory Visit (HOSPITAL_COMMUNITY): Payer: Self-pay

## 2021-03-08 MED ORDER — BUPROPION HCL ER (XL) 150 MG PO TB24
ORAL_TABLET | ORAL | 0 refills | Status: DC
  Filled 2021-03-08: qty 90, 90d supply, fill #0

## 2021-03-12 ENCOUNTER — Encounter (HOSPITAL_BASED_OUTPATIENT_CLINIC_OR_DEPARTMENT_OTHER): Payer: Self-pay | Admitting: Nurse Practitioner

## 2021-03-12 ENCOUNTER — Ambulatory Visit (HOSPITAL_BASED_OUTPATIENT_CLINIC_OR_DEPARTMENT_OTHER): Payer: 59 | Admitting: Nurse Practitioner

## 2021-03-12 ENCOUNTER — Other Ambulatory Visit: Payer: Self-pay

## 2021-03-12 ENCOUNTER — Other Ambulatory Visit (HOSPITAL_COMMUNITY): Payer: Self-pay

## 2021-03-12 VITALS — BP 128/86 | HR 86 | Ht 66.0 in | Wt 182.0 lb

## 2021-03-12 DIAGNOSIS — F900 Attention-deficit hyperactivity disorder, predominantly inattentive type: Secondary | ICD-10-CM

## 2021-03-12 DIAGNOSIS — R110 Nausea: Secondary | ICD-10-CM

## 2021-03-12 DIAGNOSIS — E6609 Other obesity due to excess calories: Secondary | ICD-10-CM | POA: Diagnosis not present

## 2021-03-12 DIAGNOSIS — Z0000 Encounter for general adult medical examination without abnormal findings: Secondary | ICD-10-CM

## 2021-03-12 DIAGNOSIS — Z6830 Body mass index (BMI) 30.0-30.9, adult: Secondary | ICD-10-CM

## 2021-03-12 DIAGNOSIS — I10 Essential (primary) hypertension: Secondary | ICD-10-CM

## 2021-03-12 MED ORDER — SEMAGLUTIDE-WEIGHT MANAGEMENT 0.5 MG/0.5ML ~~LOC~~ SOAJ
0.5000 mg | SUBCUTANEOUS | 0 refills | Status: DC
  Filled 2021-03-12: qty 2, 28d supply, fill #0

## 2021-03-12 MED ORDER — SEMAGLUTIDE-WEIGHT MANAGEMENT 1 MG/0.5ML ~~LOC~~ SOAJ
1.0000 mg | SUBCUTANEOUS | 3 refills | Status: DC
  Filled 2021-03-12: qty 2, fill #0

## 2021-03-12 MED ORDER — ONDANSETRON 4 MG PO TBDP
4.0000 mg | ORAL_TABLET | Freq: Three times a day (TID) | ORAL | 3 refills | Status: DC | PRN
  Filled 2021-03-12: qty 20, 7d supply, fill #0

## 2021-03-12 MED ORDER — AMPHETAMINE-DEXTROAMPHET ER 15 MG PO CP24
15.0000 mg | ORAL_CAPSULE | ORAL | 0 refills | Status: DC
  Filled 2021-03-12: qty 30, 30d supply, fill #0

## 2021-03-12 MED ORDER — SEMAGLUTIDE-WEIGHT MANAGEMENT 0.25 MG/0.5ML ~~LOC~~ SOAJ: 0.2500 mg | SUBCUTANEOUS | 0 refills | Status: DC

## 2021-03-12 MED ORDER — SEMAGLUTIDE-WEIGHT MANAGEMENT 0.25 MG/0.5ML ~~LOC~~ SOAJ
0.2500 mg | SUBCUTANEOUS | 0 refills | Status: DC
  Filled 2021-03-12: qty 2, 28d supply, fill #0

## 2021-03-12 MED ORDER — METFORMIN HCL 500 MG PO TABS
500.0000 mg | ORAL_TABLET | Freq: Two times a day (BID) | ORAL | 3 refills | Status: DC
  Filled 2021-03-12: qty 180, 90d supply, fill #0

## 2021-03-12 MED ORDER — AMPHETAMINE-DEXTROAMPHET ER 15 MG PO CP24: 15.0000 mg | ORAL_CAPSULE | ORAL | 0 refills | Status: DC

## 2021-03-12 NOTE — Patient Instructions (Addendum)
Thank you for choosing [place] at [facility] for your Primary Care needs. I am excited for the opportunity to partner with you to meet your health care goals. It was a pleasure meeting you today!  Recommendations from today's visit: I have sent in the refills today  I have sent the [facility] panel for you.  We will check labs today and make sure that everything looks ok.   Information on diet, exercise, and health maintenance recommendations are listed below. This is information to help you be sure you are on track for optimal health and monitoring.   Please look over this and let us know if you have any questions or if you have completed any of the health maintenance outside of [place] so that we can be sure your records are up to date.  ___________________________________________________________ About Me: I am an Adult-Geriatric Nurse Practitioner with a background in caring for patients for more than 20 years with a strong intensive care background. I provide primary care and sports medicine services to patients age 93 and older within this office. My education had a strong focus on caring for the older adult population, which I am passionate about. I am also the director of the APP Fellowship with [facility].   My desire is to provide you with the best service through preventive medicine and supportive care. I consider you a part of the medical team and value your input. I work diligently to ensure that you are heard and your needs are met in a safe and effective manner. I want you to feel comfortable with me as your provider and want you to know that your health concerns are important to me.  For your information, our office hours are: Monday, Tuesday, and Thursday 8:00 AM - 5:00 PM Wednesday and Friday 8:00 AM - 12:00 PM.   In my time away from the office I am teaching new APP's within the system and am unavailable, but my partner, Dr. Burnard Bunting is in the office for  emergent needs.   If you have questions or concerns, please call our office at [phone] or send [place] a MyChart message and we will respond as quickly as possible.  ____________________________________________________________ MyChart:  For all urgent or time sensitive needs we ask that you please call the office to avoid delays. Our number is [phone]. MyChart is not constantly monitored and due to the large volume of messages a day, replies may take up to 72 business hours.  MyChart Policy: MyChart allows for you to see your visit notes, after visit summary, provider recommendations, lab and tests results, make an appointment, request refills, and contact your provider or the office for non-urgent questions or concerns. Providers are seeing patients during normal business hours and do not have built in time to review MyChart messages.  We ask that you allow a minimum of 3 business days for responses to [employer]. For this reason, please do not send urgent requests through [place]. Please call the office at [phone]. New and ongoing conditions may require a visit. We have virtual and in person visit available for your convenience.  Complex MyChart concerns may require a visit. Your provider may request you schedule a virtual or in person visit to ensure we are providing the best care possible. MyChart messages sent after 11:00 AM on Friday will not be received by the provider until Monday morning.    Lab and Test Results: You will receive your lab and test  results on MyChart as soon as they are completed and results have been sent by the lab or testing facility. Due to this service, you will receive your results BEFORE your provider.  I review lab and tests results each morning prior to seeing patients. Some results require collaboration with other providers to ensure you are receiving the most appropriate care. For this reason, we ask that you please allow a minimum of 3-5  business days from the time the ALL results have been received for your provider to receive and review lab and test results and contact you about these.  Most lab and test result comments from the provider will be sent through [place]. Your provider may recommend changes to the plan of care, follow-up visits, repeat testing, ask questions, or request an office visit to discuss these results. You may reply directly to this message or call the office at [phone] to provide information for the provider or set up an appointment. In some instances, you will be called with test results and recommendations. Please let us know if this is preferred and we will make note of this in your chart to provide this for you.    If you have not heard a response to your lab or test results in 5 business days from all results returning to [place], please call the office to let us know. We ask that you please avoid calling prior to this time unless there is an emergent concern. Due to high call volumes, this can delay the resulting process.  After Hours: For all non-emergency after hours needs, please call the office at [phone] and select the option to reach the on-call provider service. On-call services are shared between multiple [place] offices and therefore it will not be possible to speak directly with your provider. On-call providers may provide medical advice and recommendations, but are unable to provide refills for maintenance medications.  For all emergency or urgent medical needs after normal business hours, we recommend that you seek care at the closest Urgent Care or Emergency Department to ensure appropriate treatment in a timely manner.  [facility] has a 24 hour emergency room located on the ground floor for your convenience.   Urgent Concerns During the Business Day Providers are seeing patients from 8AM to [place] with a busy schedule and are most often not able to respond to  non-urgent calls until the end of the day or the next business day. If you should have URGENT concerns during the day, please call and speak to the nurse or schedule a same day appointment so that we can address your concern without delay.   Thank you, again, for choosing me as your health care partner. I appreciate your trust and look forward to learning more about you.   Worthy Keeler, DNP, AGNP-c ___________________________________________________________  Health Maintenance Recommendations Screening Testing Mammogram Every 1 -2 years based on history and risk factors Starting at age 40 Pap Smear Ages 21-39 every 3 years Ages 78-65 every 5 years with HPV testing More frequent testing may be required based on results and history Colon Cancer Screening Every 1-10 years based on test performed, risk factors, and history Starting at age 20 Bone Density Screening Every 2-10 years based on history Starting at age 54 for women Recommendations for men differ based on medication usage, history, and risk factors AAA Screening One time ultrasound Men 23-31 years old who have every smoked Lung Cancer Screening Low Dose Lung CT every 12  months Age 65-80 years with a 30 pack-year smoking history who still smoke or who have quit within the last 15 years  Screening Labs Routine  Labs: Complete Blood Count (CBC), Complete Metabolic Panel (CMP), Cholesterol (Lipid Panel) Every 6-12 months based on history and medications May be recommended more frequently based on current conditions or previous results Hemoglobin A1c Lab Every 3-12 months based on history and previous results Starting at age 62 or earlier with diagnosis of diabetes, high cholesterol, BMI >26, and/or risk factors Frequent monitoring for patients with diabetes to ensure blood sugar control Thyroid Panel (TSH w/ T3 & T4) Every 6 months based on history, symptoms, and risk factors May be repeated more often if on  medication HIV One time testing for all patients 51 and older May be repeated more frequently for patients with increased risk factors or exposure Hepatitis C One time testing for all patients 80 and older May be repeated more frequently for patients with increased risk factors or exposure Gonorrhea, Chlamydia Every 12 months for all sexually active persons 13-24 years Additional monitoring may be recommended for those who are considered high risk or who have symptoms PSA Men 55-86 years old with risk factors Additional screening may be recommended from age 31-69 based on risk factors, symptoms, and history  Vaccine Recommendations Tetanus Booster All adults every 10 years Flu Vaccine All patients 6 months and older every year COVID Vaccine All patients 12 years and older Initial dosing with booster May recommend additional booster based on age and health history HPV Vaccine 2 doses all patients age 93-26 Dosing may be considered for patients over 26 Shingles Vaccine (Shingrix) 2 doses all adults 47 years and older Pneumonia (Pneumovax 23) All adults 87 years and older May recommend earlier dosing based on health history Pneumonia (Prevnar 59) All adults 52 years and older Dosed 1 year after Pneumovax 23  Additional Screening, Testing, and Vaccinations may be recommended on an individualized basis based on family history, health history, risk factors, and/or exposure.  __________________________________________________________  Diet Recommendations for All Patients  I recommend that all patients maintain a diet low in saturated fats, carbohydrates, and cholesterol. While this can be challenging at first, it is not impossible and small changes can make big differences.  Things to try: Decreasing the amount of soda, sweet tea, and/or juice to one or less per day and replace with water While water is always the first choice, if you do not like water you may consider adding a  water additive without sugar to improve the taste other sugar free drinks Replace potatoes with a brightly colored vegetable at dinner Use healthy oils, such as canola oil or olive oil, instead of butter or hard margarine Limit your bread intake to two pieces or less a day Replace regular pasta with low carb pasta options Bake, broil, or grill foods instead of frying Monitor portion sizes  Eat smaller, more frequent meals throughout the day instead of large meals  An important thing to remember is, if you love foods that are not great for your health, you don't have to give them up completely. Instead, allow these foods to be a reward when you have done well. Allowing yourself to still have special treats every once in a while is a nice way to tell yourself thank you for working hard to keep yourself healthy.   Also remember that every day is a new day. If you have a bad day and "fall off the wagon",  you can still climb right back up and keep moving along on your journey!  We have resources available to help you!  Some websites that may be helpful include: www.http://carter.biz/  Www.VeryWellFit.com _____________________________________________________________  Activity Recommendations for All Patients  I recommend that all adults get at least 20 minutes of moderate physical activity that elevates your heart rate at least 5 days out of the week.  Some examples include: Walking or jogging at a pace that allows you to carry on a conversation Cycling (stationary bike or outdoors) Water aerobics Yoga Weight lifting Dancing If physical limitations prevent you from putting stress on your joints, exercise in a pool or seated in a chair are excellent options.  Do determine your MAXIMUM heart rate for activity: YOUR AGE - 220 = MAX HeartRate   Remember! Do not push yourself too hard.  Start slowly and build up your pace, speed, weight, time in exercise, etc.  Allow your body to rest between  exercise and get good sleep. You will need more water than normal when you are exerting yourself. Do not wait until you are thirsty to drink. Drink with a purpose of getting in at least 8, 8 ounce glasses of water a day plus more depending on how much you exercise and sweat.    If you begin to develop dizziness, chest pain, abdominal pain, jaw pain, shortness of breath, headache, vision changes, lightheadedness, or other concerning symptoms, stop the activity and allow your body to rest. If your symptoms are severe, seek emergency evaluation immediately. If your symptoms are concerning, but not severe, please let us know so that we can recommend further evaluation.

## 2021-03-12 NOTE — Progress Notes (Incomplete)
[name], DNP, AGNP-c Primary Care & Sports Medicine [address]   [place], [place] [address] [phone]  New patient visit   Patient: Laurie [name]   DOB: 02-May-1980   41 y.o. Female  MRN: [id number] Visit Date: 03/12/2021  Patient Care Team: Syris Brookens, Coralee Pesa, NP as PCP - General (Nurse Practitioner)  Today's healthcare provider: [name], NP   Chief Complaint  Patient presents with   New Patient (Initial Visit)    Patient presents to establish care. 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 Needs a refill Adderall. Discuss [id number] and other medication.    Subjective    Laurie [name] is a 41 y.o. female who presents today as a new patient to establish care.    Patient endorses the following concerns presently:  History reviewed and reveals the following: Past Medical History:  Diagnosis Date   Dysrhythmia    PVCs in grad school and with pregnancy none recently   Family history of adverse reaction to anesthesia    states twin sister is hard to arouse post-op   Infertility associated with anovulation    Plantar fasciitis of right foot 02/03/2014   Tightness of right heel cord 02/03/2014   Ulcerative colitis ([place]) 2018   Past Surgical History:  Procedure Laterality Date   CARPAL TUNNEL RELEASE Left 10/04/2012   Procedure: CARPAL TUNNEL RELEASE;  Surgeon: Tennis Must, MD;  Location: [facility];  Service: Orthopedics;  Laterality: Left;   CARPAL TUNNEL RELEASE Right 12/24/2012   Procedure: RIGHT CARPAL TUNNEL RELEASE;  Surgeon: Tennis Must, MD;  Location: [facility];  Service: Orthopedics;  Laterality: Right;   ERCP  2009   with sphincterotomy   GASTROC RECESSION EXTREMITY Right 02/13/2014   Procedure: RIGHT GASTROCNEMIUS RECESSION;  Surgeon: Wylene Simmer, MD;  Location: [facility];  Service: Orthopedics;  Laterality: Right;   MASS EXCISION Left 12/18/2012    Procedure: EXCISION OF LEFT POSTERIOR  SHOULDER DERMATOFIBROMA;  Surgeon: Gwenyth Ober, MD;  Location: [facility];  Service: General;  Laterality: Left;   pancreatic stent  2009   pancreatitis   PLANTAR FASCIA RELEASE Right 02/13/2014   Procedure: RIGHT PLANTAR FASCIA RELEASE;  Surgeon: Wylene Simmer, MD;  Location: [facility];  Service: Orthopedics;  Laterality: Right;   WISDOM TOOTH EXTRACTION     Family Status  Relation Name Status   PGF  Deceased   PGM  Deceased   MGM  Deceased   MGF  Deceased   Father  Alive       died of cardiac arrhythmia from light chain amyloidosis   Mother  Alive   Family History  Problem Relation Age of Onset   Heart attack Paternal Grandfather    COPD Maternal Grandmother    Stroke Maternal Grandfather    Hyperlipidemia Father    Heart Problems Father    Hypertension Mother    Breast cancer Mother    Social History   Socioeconomic History   Marital status: Married    Spouse name: Not on file   Number of children: Not on file   Years of education: Not on file   Highest education level: Not on file  Occupational History   Not on file  Tobacco Use   Smoking status: Former   Smokeless tobacco: Never   Tobacco comments:    quit in college  Vaping Use  Vaping Use: Never used  Substance and Sexual Activity   Alcohol use: No    Alcohol/week: 0.0 standard drinks    Comment: occasionally   Drug use: No   Sexual activity: Yes    Birth control/protection: Pill  Other Topics Concern   Not on file  Social History Narrative   Not on file   Social Determinants of Health   Financial Resource Strain: Not on file  Food Insecurity: Not on file  Transportation Needs: Not on file  Physical Activity: Not on file  Stress: Not on file  Social Connections: Not on file   Outpatient Medications Prior to Visit  Medication Sig   amphetamine-dextroamphetamine (ADDERALL XR) 15 MG 24 hr capsule  Take 15 mg by mouth daily.   buPROPion (WELLBUTRIN XL) 150 MG 24 hr tablet Take 150 mg by mouth daily.   buPROPion (WELLBUTRIN XL) 150 MG 24 hr tablet Take 1 tablet by mouth daily   metoprolol succinate (TOPROL-XL) 25 MG 24 hr tablet Take 25 mg by mouth daily.   norethindrone-ethinyl estradiol (LOESTRIN) 1-20 MG-MCG tablet Take 1 tablet by mouth daily.   ondansetron (ZOFRAN-ODT) 4 MG disintegrating tablet Take 4 mg by mouth every 8 (eight) hours as needed.   pantoprazole (PROTONIX) 40 MG tablet Take 40 mg by mouth daily.   sertraline (ZOLOFT) 50 MG tablet Take 50 mg by mouth daily.   spironolactone (ALDACTONE) 25 MG tablet Take 25 mg by mouth 2 (two) times daily.   SUMAtriptan (IMITREX) 25 MG tablet 25 mg as needed.   No facility-administered medications prior to visit.   No Known Allergies Immunization History  Administered Date(s) Administered   HPV 9-valent 03/02/2021   MMR 09/11/2012   Rho (D) Immune Globulin 09/10/2012   Tdap 09/10/2012, 02/26/2015    Health Maintenance Due: Health Maintenance  Topic Date Due   COVID-19 Vaccine (1) Never done   Hepatitis C Screening  Never done   PAP SMEAR-Modifier  03/02/2024   TETANUS/TDAP  02/25/2025   INFLUENZA VACCINE  Completed   HIV Screening  Completed   HPV VACCINES  Aged Out    Review of Systems All review of systems negative except what is listed in the HPI   Objective    BP 128/86    Pulse 86    Ht 5' 6"  (1.676 m)    Wt 182 lb (82.6 kg)    LMP 02/16/2021    SpO2 90%    BMI 29.38 kg/m  Physical Exam  No results found for any visits on 03/12/21.  Assessment & Plan      Problem List Items Addressed This Visit   None    No follow-ups on file.     Time: ***minutes, >50% spent counseling, care coordination, chart review, and documentation.    Fleming Prill, Coralee Pesa, NP, DNP, AGNP-C Primary Care & Sports Medicine at [place]

## 2021-03-13 ENCOUNTER — Other Ambulatory Visit (HOSPITAL_COMMUNITY): Payer: Self-pay

## 2021-03-13 LAB — LIPID PANEL
Chol/HDL Ratio: 2.8 ratio (ref 0.0–4.4)
Cholesterol, Total: 240 mg/dL — ABNORMAL HIGH (ref 100–199)
HDL: 87 mg/dL (ref >39)
LDL Chol Calc (NIH): 127 mg/dL — ABNORMAL HIGH (ref 0–99)
Triglycerides: 152 mg/dL — ABNORMAL HIGH (ref 0–149)
VLDL Cholesterol Cal: 26 mg/dL (ref 5–40)

## 2021-03-13 LAB — CBC WITH DIFFERENTIAL/PLATELET
Basophils Absolute: 0.1 10*3/uL (ref 0.0–0.2)
Basos: 1 %
EOS (ABSOLUTE): 0.5 10*3/uL — ABNORMAL HIGH (ref 0.0–0.4)
Eos: 6 %
Hematocrit: 43.9 % (ref 34.0–46.6)
Hemoglobin: 15 g/dL (ref 11.1–15.9)
Immature Grans (Abs): 0 10*3/uL (ref 0.0–0.1)
Immature Granulocytes: 0 %
Lymphocytes Absolute: 2 10*3/uL (ref 0.7–3.1)
Lymphs: 24 %
MCH: 31.9 pg (ref 26.6–33.0)
MCHC: 34.2 g/dL (ref 31.5–35.7)
MCV: 93 fL (ref 79–97)
Monocytes Absolute: 0.5 10*3/uL (ref 0.1–0.9)
Monocytes: 6 %
Neutrophils Absolute: 5.4 10*3/uL (ref 1.4–7.0)
Neutrophils: 63 %
Platelets: 369 10*3/uL (ref 150–450)
RBC: 4.7 x10E6/uL (ref 3.77–5.28)
RDW: 11.8 % (ref 11.7–15.4)
WBC: 8.6 10*3/uL (ref 3.4–10.8)

## 2021-03-13 LAB — COMPREHENSIVE METABOLIC PANEL
ALT: 13 IU/L (ref 0–32)
AST: 13 IU/L (ref 0–40)
Albumin/Globulin Ratio: 1.7 (ref 1.2–2.2)
Albumin: 4.5 g/dL (ref 3.8–4.8)
Alkaline Phosphatase: 78 IU/L (ref 44–121)
BUN/Creatinine Ratio: 16 (ref 9–23)
BUN: 11 mg/dL (ref 6–20)
Bilirubin Total: 0.4 mg/dL (ref 0.0–1.2)
CO2: 19 mmol/L — ABNORMAL LOW (ref 20–29)
Calcium: 9.6 mg/dL (ref 8.7–10.2)
Chloride: 101 mmol/L (ref 96–106)
Creatinine, Ser: 0.7 mg/dL (ref 0.57–1.00)
Globulin, Total: 2.7 g/dL (ref 1.5–4.5)
Glucose: 81 mg/dL (ref 70–99)
Potassium: 4.2 mmol/L (ref 3.5–5.2)
Sodium: 141 mmol/L (ref 134–144)
Total Protein: 7.2 g/dL (ref 6.0–8.5)
eGFR: 113 mL/min/{1.73_m2} (ref >59)

## 2021-03-13 LAB — VITAMIN D 25 HYDROXY (VIT D DEFICIENCY, FRACTURES): Vit D, 25-Hydroxy: 39 ng/mL (ref 30.0–100.0)

## 2021-03-13 LAB — HEMOGLOBIN A1C
Est. average glucose Bld gHb Est-mCnc: 97 mg/dL
Hgb A1c MFr Bld: 5 % (ref 4.8–5.6)

## 2021-03-13 LAB — TSH: TSH: 1.96 u[IU]/mL (ref 0.450–4.500)

## 2021-03-15 ENCOUNTER — Encounter (HOSPITAL_BASED_OUTPATIENT_CLINIC_OR_DEPARTMENT_OTHER): Payer: Self-pay | Admitting: Nurse Practitioner

## 2021-03-19 ENCOUNTER — Other Ambulatory Visit (HOSPITAL_COMMUNITY): Payer: Self-pay

## 2021-03-22 ENCOUNTER — Other Ambulatory Visit (HOSPITAL_COMMUNITY): Payer: Self-pay

## 2021-03-22 NOTE — Assessment & Plan Note (Signed)
Chronic. Well controlled on adderall xr.  ?No alarm sx present today ?PDMP reviewed ?Will send refills for 3 months ?Plan to f/u in 3 months with VV for further refills ?

## 2021-03-22 NOTE — Assessment & Plan Note (Signed)
Chronic. Efforts for weight loss with diet and exercise unsuccessful.  ?Discussed option of GLP-1 to help with weight loss efforts and portion control. Benefit to include CV risk reduction in setting of HTN hx.  ?Will obtain labs today for baseline and plan to f/u in 3 months to evaluate how she is doing on the medication or sooner if needed.  ?Continue monitoring portion and daily activity.  ?

## 2021-03-22 NOTE — Assessment & Plan Note (Signed)
Controlled on current regimen.  ?No alarm sx present today ?Continue metoprolol and monitor for new or worsening symptoms and report immediately.  ?Labs today ?

## 2021-03-24 ENCOUNTER — Other Ambulatory Visit (HOSPITAL_COMMUNITY): Payer: Self-pay

## 2021-03-25 ENCOUNTER — Other Ambulatory Visit (HOSPITAL_COMMUNITY): Payer: Self-pay

## 2021-03-25 ENCOUNTER — Encounter (HOSPITAL_BASED_OUTPATIENT_CLINIC_OR_DEPARTMENT_OTHER): Payer: Self-pay | Admitting: Nurse Practitioner

## 2021-03-25 MED ORDER — SERTRALINE HCL 50 MG PO TABS
ORAL_TABLET | ORAL | 1 refills | Status: DC
  Filled 2021-03-25: qty 90, 90d supply, fill #0

## 2021-03-25 MED ORDER — SPIRONOLACTONE 25 MG PO TABS
ORAL_TABLET | ORAL | 3 refills | Status: DC
  Filled 2021-03-25: qty 60, 30d supply, fill #0
  Filled 2021-05-07: qty 60, 30d supply, fill #1
  Filled 2021-06-16: qty 60, 30d supply, fill #2

## 2021-03-25 NOTE — Telephone Encounter (Signed)
Patient requesting refill of Zoloft. This medication has not been previously prescribed by you. Please approve if deemed appropriate. AS, CMA ?

## 2021-03-28 ENCOUNTER — Other Ambulatory Visit (HOSPITAL_COMMUNITY): Payer: Self-pay

## 2021-03-28 MED ORDER — SERTRALINE HCL 50 MG PO TABS
50.0000 mg | ORAL_TABLET | Freq: Every day | ORAL | 3 refills | Status: DC
  Filled 2021-03-28 – 2021-06-29 (×2): qty 90, 90d supply, fill #0
  Filled 2022-01-21: qty 90, 90d supply, fill #1

## 2021-04-05 ENCOUNTER — Encounter (HOSPITAL_BASED_OUTPATIENT_CLINIC_OR_DEPARTMENT_OTHER): Payer: Self-pay | Admitting: Nurse Practitioner

## 2021-04-06 ENCOUNTER — Other Ambulatory Visit (HOSPITAL_COMMUNITY): Payer: Self-pay

## 2021-04-06 ENCOUNTER — Other Ambulatory Visit (HOSPITAL_BASED_OUTPATIENT_CLINIC_OR_DEPARTMENT_OTHER): Payer: Self-pay

## 2021-04-06 DIAGNOSIS — E6609 Other obesity due to excess calories: Secondary | ICD-10-CM

## 2021-04-06 DIAGNOSIS — I10 Essential (primary) hypertension: Secondary | ICD-10-CM

## 2021-04-06 MED ORDER — SEMAGLUTIDE-WEIGHT MANAGEMENT 1 MG/0.5ML ~~LOC~~ SOAJ
1.0000 mg | SUBCUTANEOUS | 3 refills | Status: DC
  Filled 2021-04-06: qty 2, fill #0

## 2021-04-06 MED ORDER — SEMAGLUTIDE-WEIGHT MANAGEMENT 0.5 MG/0.5ML ~~LOC~~ SOAJ
0.5000 mg | SUBCUTANEOUS | 0 refills | Status: AC
  Filled 2021-04-06: qty 2, 30d supply, fill #0
  Filled 2021-04-09: qty 2, 28d supply, fill #0

## 2021-04-07 ENCOUNTER — Other Ambulatory Visit (HOSPITAL_COMMUNITY): Payer: Self-pay

## 2021-04-08 ENCOUNTER — Other Ambulatory Visit (HOSPITAL_COMMUNITY): Payer: Self-pay

## 2021-04-09 ENCOUNTER — Other Ambulatory Visit (HOSPITAL_COMMUNITY): Payer: Self-pay

## 2021-04-09 ENCOUNTER — Other Ambulatory Visit (HOSPITAL_BASED_OUTPATIENT_CLINIC_OR_DEPARTMENT_OTHER): Payer: Self-pay

## 2021-04-12 ENCOUNTER — Telehealth (HOSPITAL_BASED_OUTPATIENT_CLINIC_OR_DEPARTMENT_OTHER): Payer: Self-pay

## 2021-04-12 NOTE — Telephone Encounter (Signed)
Received fax from [facility] patient is approved for [facility]. Patient is aware of the approval.  ?

## 2021-04-19 ENCOUNTER — Encounter (HOSPITAL_BASED_OUTPATIENT_CLINIC_OR_DEPARTMENT_OTHER): Payer: Self-pay | Admitting: Nurse Practitioner

## 2021-04-20 ENCOUNTER — Other Ambulatory Visit (HOSPITAL_BASED_OUTPATIENT_CLINIC_OR_DEPARTMENT_OTHER): Payer: Self-pay | Admitting: Nurse Practitioner

## 2021-04-20 DIAGNOSIS — R110 Nausea: Secondary | ICD-10-CM

## 2021-04-20 DIAGNOSIS — F900 Attention-deficit hyperactivity disorder, predominantly inattentive type: Secondary | ICD-10-CM

## 2021-04-20 DIAGNOSIS — G43909 Migraine, unspecified, not intractable, without status migrainosus: Secondary | ICD-10-CM

## 2021-04-20 MED ORDER — SUMATRIPTAN SUCCINATE 25 MG PO TABS
25.0000 mg | ORAL_TABLET | Freq: Every day | ORAL | 11 refills | Status: DC | PRN
  Filled 2021-04-20: qty 18, 30d supply, fill #0
  Filled 2021-04-21: qty 13, 13d supply, fill #0
  Filled 2021-04-21: qty 5, 5d supply, fill #0
  Filled 2021-04-21: qty 13, 22d supply, fill #0
  Filled 2021-04-21: qty 5, 8d supply, fill #0
  Filled 2021-08-05: qty 7, 5d supply, fill #1
  Filled 2021-08-05: qty 11, 25d supply, fill #1
  Filled 2021-12-09: qty 18, 30d supply, fill #2

## 2021-04-20 MED ORDER — AMPHETAMINE-DEXTROAMPHET ER 15 MG PO CP24
15.0000 mg | ORAL_CAPSULE | ORAL | 0 refills | Status: DC
  Filled 2021-04-20: qty 30, 30d supply, fill #0

## 2021-04-20 MED ORDER — AMPHETAMINE-DEXTROAMPHET ER 15 MG PO CP24
15.0000 mg | ORAL_CAPSULE | ORAL | 0 refills | Status: DC
  Filled 2021-06-14: qty 30, 30d supply, fill #0

## 2021-04-20 MED ORDER — ONDANSETRON 4 MG PO TBDP
4.0000 mg | ORAL_TABLET | Freq: Three times a day (TID) | ORAL | 11 refills | Status: DC | PRN
  Filled 2021-04-20: qty 30, 10d supply, fill #0
  Filled 2021-08-05: qty 30, 10d supply, fill #1
  Filled 2021-09-14: qty 30, 10d supply, fill #2
  Filled 2022-02-28 – 2022-03-28 (×2): qty 30, 10d supply, fill #3

## 2021-04-20 MED ORDER — AMPHETAMINE-DEXTROAMPHET ER 15 MG PO CP24
15.0000 mg | ORAL_CAPSULE | ORAL | 0 refills | Status: DC
  Filled 2021-09-01: qty 30, 30d supply, fill #0

## 2021-04-21 ENCOUNTER — Other Ambulatory Visit (HOSPITAL_COMMUNITY): Payer: Self-pay

## 2021-05-04 ENCOUNTER — Other Ambulatory Visit (HOSPITAL_BASED_OUTPATIENT_CLINIC_OR_DEPARTMENT_OTHER): Payer: Self-pay

## 2021-05-04 ENCOUNTER — Other Ambulatory Visit (HOSPITAL_COMMUNITY): Payer: Self-pay

## 2021-05-04 DIAGNOSIS — I10 Essential (primary) hypertension: Secondary | ICD-10-CM

## 2021-05-04 DIAGNOSIS — E6609 Other obesity due to excess calories: Secondary | ICD-10-CM

## 2021-05-04 MED ORDER — SEMAGLUTIDE-WEIGHT MANAGEMENT 1 MG/0.5ML ~~LOC~~ SOAJ
1.0000 mg | SUBCUTANEOUS | 3 refills | Status: AC
  Filled 2021-05-04: qty 2, 28d supply, fill #0

## 2021-05-07 ENCOUNTER — Other Ambulatory Visit (HOSPITAL_COMMUNITY): Payer: Self-pay

## 2021-06-07 ENCOUNTER — Encounter (HOSPITAL_BASED_OUTPATIENT_CLINIC_OR_DEPARTMENT_OTHER): Payer: Self-pay | Admitting: Nurse Practitioner

## 2021-06-08 ENCOUNTER — Other Ambulatory Visit (HOSPITAL_BASED_OUTPATIENT_CLINIC_OR_DEPARTMENT_OTHER): Payer: Self-pay

## 2021-06-08 ENCOUNTER — Other Ambulatory Visit (HOSPITAL_COMMUNITY): Payer: Self-pay

## 2021-06-08 MED ORDER — SEMAGLUTIDE-WEIGHT MANAGEMENT 1.7 MG/0.75ML ~~LOC~~ SOAJ
1.7000 mg | Freq: Once | SUBCUTANEOUS | 0 refills | Status: AC
  Filled 2021-06-08: qty 3, 28d supply, fill #0

## 2021-06-09 ENCOUNTER — Other Ambulatory Visit (HOSPITAL_COMMUNITY): Payer: Self-pay

## 2021-06-14 ENCOUNTER — Ambulatory Visit (INDEPENDENT_AMBULATORY_CARE_PROVIDER_SITE_OTHER): Payer: 59 | Admitting: Nurse Practitioner

## 2021-06-14 ENCOUNTER — Other Ambulatory Visit (HOSPITAL_COMMUNITY): Payer: Self-pay

## 2021-06-16 ENCOUNTER — Other Ambulatory Visit (HOSPITAL_BASED_OUTPATIENT_CLINIC_OR_DEPARTMENT_OTHER): Payer: Self-pay | Admitting: Nurse Practitioner

## 2021-06-16 ENCOUNTER — Other Ambulatory Visit (HOSPITAL_COMMUNITY): Payer: Self-pay

## 2021-06-16 MED ORDER — BUPROPION HCL ER (XL) 150 MG PO TB24
ORAL_TABLET | ORAL | 0 refills | Status: DC
  Filled 2021-06-16: qty 90, 90d supply, fill #0

## 2021-06-24 ENCOUNTER — Ambulatory Visit
Admission: RE | Admit: 2021-06-24 | Discharge: 2021-06-24 | Disposition: A | Discharge status: Home / Self Care | Payer: 59 | Source: Ambulatory Visit | Attending: Obstetrics & Gynecology | Admitting: Obstetrics & Gynecology

## 2021-06-24 DIAGNOSIS — Z1231 Encounter for screening mammogram for malignant neoplasm of breast: Secondary | ICD-10-CM

## 2021-06-28 ENCOUNTER — Other Ambulatory Visit: Payer: Self-pay | Admitting: Obstetrics & Gynecology

## 2021-06-28 DIAGNOSIS — R928 Other abnormal and inconclusive findings on diagnostic imaging of breast: Secondary | ICD-10-CM

## 2021-06-29 ENCOUNTER — Other Ambulatory Visit (HOSPITAL_COMMUNITY): Payer: Self-pay

## 2021-06-30 ENCOUNTER — Ambulatory Visit
Admission: RE | Admit: 2021-06-30 | Discharge: 2021-06-30 | Disposition: A | Discharge status: Home / Self Care | Payer: 59 | Source: Ambulatory Visit | Attending: Obstetrics & Gynecology | Admitting: Obstetrics & Gynecology

## 2021-06-30 DIAGNOSIS — R928 Other abnormal and inconclusive findings on diagnostic imaging of breast: Secondary | ICD-10-CM

## 2021-06-30 NOTE — Progress Notes (Signed)
Unable to reach patient at time of appointment.  Okay to reschedule if she calls back.

## 2021-07-02 ENCOUNTER — Other Ambulatory Visit: Payer: 59

## 2021-07-12 ENCOUNTER — Encounter (HOSPITAL_BASED_OUTPATIENT_CLINIC_OR_DEPARTMENT_OTHER): Payer: Self-pay | Admitting: Nurse Practitioner

## 2021-07-13 ENCOUNTER — Other Ambulatory Visit (HOSPITAL_BASED_OUTPATIENT_CLINIC_OR_DEPARTMENT_OTHER): Payer: Self-pay

## 2021-07-13 DIAGNOSIS — E6609 Other obesity due to excess calories: Secondary | ICD-10-CM

## 2021-07-13 MED ORDER — SEMAGLUTIDE-WEIGHT MANAGEMENT 1.7 MG/0.75ML ~~LOC~~ SOAJ
1.7000 mg | SUBCUTANEOUS | 0 refills | Status: DC
  Filled 2021-07-13: qty 3, 28d supply, fill #0

## 2021-08-05 ENCOUNTER — Other Ambulatory Visit (HOSPITAL_COMMUNITY): Payer: Self-pay

## 2021-08-10 ENCOUNTER — Other Ambulatory Visit (HOSPITAL_COMMUNITY): Payer: Self-pay

## 2021-08-12 ENCOUNTER — Encounter (HOSPITAL_BASED_OUTPATIENT_CLINIC_OR_DEPARTMENT_OTHER): Payer: Self-pay | Admitting: Nurse Practitioner

## 2021-08-12 ENCOUNTER — Other Ambulatory Visit (HOSPITAL_BASED_OUTPATIENT_CLINIC_OR_DEPARTMENT_OTHER): Payer: Self-pay | Admitting: Nurse Practitioner

## 2021-08-12 ENCOUNTER — Other Ambulatory Visit (HOSPITAL_COMMUNITY): Payer: Self-pay

## 2021-08-12 DIAGNOSIS — E6609 Other obesity due to excess calories: Secondary | ICD-10-CM

## 2021-08-12 MED ORDER — WEGOVY 1.7 MG/0.75ML ~~LOC~~ SOAJ
1.7000 mg | SUBCUTANEOUS | 0 refills | Status: DC
  Filled 2021-08-12: qty 3, 28d supply, fill #0

## 2021-09-01 ENCOUNTER — Other Ambulatory Visit (HOSPITAL_COMMUNITY): Payer: Self-pay

## 2021-09-02 ENCOUNTER — Other Ambulatory Visit (HOSPITAL_COMMUNITY): Payer: Self-pay

## 2021-09-14 ENCOUNTER — Encounter (HOSPITAL_BASED_OUTPATIENT_CLINIC_OR_DEPARTMENT_OTHER): Payer: Self-pay | Admitting: Nurse Practitioner

## 2021-09-14 ENCOUNTER — Other Ambulatory Visit (HOSPITAL_COMMUNITY): Payer: Self-pay

## 2021-09-14 ENCOUNTER — Other Ambulatory Visit (HOSPITAL_BASED_OUTPATIENT_CLINIC_OR_DEPARTMENT_OTHER): Payer: Self-pay | Admitting: Nurse Practitioner

## 2021-09-14 DIAGNOSIS — F900 Attention-deficit hyperactivity disorder, predominantly inattentive type: Secondary | ICD-10-CM

## 2021-09-14 MED ORDER — SERTRALINE HCL 50 MG PO TABS
50.0000 mg | ORAL_TABLET | Freq: Every day | ORAL | 0 refills | Status: DC
  Filled 2021-09-14: qty 90, 90d supply, fill #0

## 2021-09-15 ENCOUNTER — Other Ambulatory Visit (HOSPITAL_BASED_OUTPATIENT_CLINIC_OR_DEPARTMENT_OTHER): Payer: Self-pay

## 2021-09-15 ENCOUNTER — Other Ambulatory Visit (HOSPITAL_COMMUNITY): Payer: Self-pay

## 2021-09-15 DIAGNOSIS — E6609 Other obesity due to excess calories: Secondary | ICD-10-CM

## 2021-09-15 MED ORDER — AMPHETAMINE-DEXTROAMPHET ER 15 MG PO CP24
15.0000 mg | ORAL_CAPSULE | ORAL | 0 refills | Status: DC
  Filled 2021-09-15 – 2021-10-19 (×2): qty 30, 30d supply, fill #0

## 2021-09-15 MED ORDER — WEGOVY 1.7 MG/0.75ML ~~LOC~~ SOAJ
1.7000 mg | SUBCUTANEOUS | 0 refills | Status: DC
  Filled 2021-09-15 – 2021-09-21 (×3): qty 3, 28d supply, fill #0

## 2021-09-16 ENCOUNTER — Other Ambulatory Visit (HOSPITAL_COMMUNITY): Payer: Self-pay

## 2021-09-17 ENCOUNTER — Other Ambulatory Visit (HOSPITAL_COMMUNITY): Payer: Self-pay

## 2021-09-20 ENCOUNTER — Other Ambulatory Visit (HOSPITAL_BASED_OUTPATIENT_CLINIC_OR_DEPARTMENT_OTHER): Payer: Self-pay

## 2021-09-20 ENCOUNTER — Encounter (HOSPITAL_BASED_OUTPATIENT_CLINIC_OR_DEPARTMENT_OTHER): Payer: Self-pay

## 2021-09-21 ENCOUNTER — Other Ambulatory Visit (HOSPITAL_BASED_OUTPATIENT_CLINIC_OR_DEPARTMENT_OTHER): Payer: Self-pay

## 2021-09-22 ENCOUNTER — Other Ambulatory Visit (HOSPITAL_BASED_OUTPATIENT_CLINIC_OR_DEPARTMENT_OTHER): Payer: Self-pay

## 2021-09-23 ENCOUNTER — Other Ambulatory Visit (HOSPITAL_COMMUNITY): Payer: Self-pay

## 2021-09-23 ENCOUNTER — Other Ambulatory Visit (HOSPITAL_BASED_OUTPATIENT_CLINIC_OR_DEPARTMENT_OTHER): Payer: Self-pay

## 2021-09-24 ENCOUNTER — Other Ambulatory Visit (HOSPITAL_BASED_OUTPATIENT_CLINIC_OR_DEPARTMENT_OTHER): Payer: Self-pay | Admitting: Nurse Practitioner

## 2021-09-24 ENCOUNTER — Other Ambulatory Visit (HOSPITAL_COMMUNITY): Payer: Self-pay

## 2021-09-24 MED ORDER — BUPROPION HCL ER (XL) 150 MG PO TB24
150.0000 mg | ORAL_TABLET | Freq: Every day | ORAL | 0 refills | Status: DC
  Filled 2021-09-24: qty 90, 90d supply, fill #0

## 2021-09-27 ENCOUNTER — Other Ambulatory Visit (HOSPITAL_COMMUNITY): Payer: Self-pay

## 2021-09-27 MED ORDER — BUPROPION HCL ER (XL) 150 MG PO TB24
150.0000 mg | ORAL_TABLET | Freq: Every day | ORAL | 0 refills | Status: DC
Start: 2021-09-27 — End: 2022-04-21
  Filled 2021-09-27 – 2022-01-11 (×2): qty 90, 90d supply, fill #0

## 2021-09-27 MED ORDER — SPIRONOLACTONE 25 MG PO TABS
25.0000 mg | ORAL_TABLET | Freq: Two times a day (BID) | ORAL | 3 refills | Status: DC
Start: 2021-09-27 — End: 2022-06-01
  Filled 2021-09-27: qty 60, 30d supply, fill #0
  Filled 2021-12-09: qty 60, 30d supply, fill #1
  Filled 2022-01-11: qty 60, 30d supply, fill #2
  Filled 2022-04-21: qty 60, 30d supply, fill #3

## 2021-09-28 ENCOUNTER — Other Ambulatory Visit (HOSPITAL_COMMUNITY): Payer: Self-pay

## 2021-10-05 ENCOUNTER — Encounter (HOSPITAL_BASED_OUTPATIENT_CLINIC_OR_DEPARTMENT_OTHER): Payer: Self-pay | Admitting: Nurse Practitioner

## 2021-10-05 ENCOUNTER — Ambulatory Visit (HOSPITAL_BASED_OUTPATIENT_CLINIC_OR_DEPARTMENT_OTHER): Payer: 59 | Admitting: Nurse Practitioner

## 2021-10-05 VITALS — BP 123/86 | HR 78 | Ht 65.0 in | Wt 155.0 lb

## 2021-10-05 DIAGNOSIS — Z01818 Encounter for other preprocedural examination: Secondary | ICD-10-CM

## 2021-10-05 DIAGNOSIS — Z6741 Type O blood, Rh negative: Secondary | ICD-10-CM | POA: Insufficient documentation

## 2021-10-05 HISTORY — DX: Encounter for other preprocedural examination: Z01.818

## 2021-10-05 NOTE — Progress Notes (Signed)
Laurie Keeler, DNP, AGNP-c [place],  [address] [phone] Office [phone] Fax  ESTABLISHED PATIENT- Chronic Health and/or Follow-Up Visit  Blood pressure 123/86, pulse 78, height 5' 5"  (1.651 m), weight 155 lb (70.3 kg), SpO2 98 %.    Laurie [name] is a 41 y.o. year old female presenting today for evaluation and management of the following: Follow-up (Patient presents today for pre op for breast enlargement 11/04/2021.)   1. Pre-op evaluation Laurie [name] presents today for preoperative examination for breast augmentation.  She has no significant chronic health history.  She has been working diligently on [employer] and has managed to lose about 30 pounds.  Her surgery is planned for November.   All ROS negative with exception of what is listed above.   PHYSICAL EXAM Physical Exam Vitals and nursing note reviewed.  Constitutional:      Appearance: Normal appearance. She is normal weight.  HENT:     Head: Normocephalic and atraumatic.  Eyes:     Extraocular Movements: Extraocular movements intact.     Pupils: Pupils are equal, round, and reactive to light.  Neck:     Vascular: No carotid bruit.  Cardiovascular:     Rate and Rhythm: Normal rate and regular rhythm.     Pulses: Normal pulses.     Heart sounds: Normal heart sounds. No murmur heard. Pulmonary:     Effort: Pulmonary effort is normal.     Breath sounds: Normal breath sounds. No wheezing or rhonchi.  Abdominal:     General: Abdomen is flat. Bowel sounds are normal. There is no distension.     Palpations: Abdomen is soft.     Tenderness: There is no abdominal tenderness. There is no guarding.  Musculoskeletal:        General: Normal range of motion.     Cervical back: Normal range of motion.     Right lower leg: No edema.     Left lower leg: No edema.  Lymphadenopathy:     Cervical: No cervical adenopathy.  Skin:     General: Skin is warm and dry.     Capillary Refill: Capillary refill takes less than 2 seconds.  Neurological:     General: No focal deficit present.     Mental Status: She is alert and oriented to person, place, and time.     Sensory: No sensory deficit.     Motor: No weakness.     Coordination: Coordination normal.     Gait: Gait normal.  Psychiatric:        Mood and Affect: Mood normal.        Behavior: Behavior normal.        Thought Content: Thought content normal.        Judgment: Judgment normal.     PLAN Problem List Items Addressed This Visit     Pre-op evaluation - Primary    Preoperative evaluation today.  Exam normal with no alarm symptoms present at this time.  We will obtain labs for further evaluation and monitoring.  At this time approval for surgery has been provided pending lab results.      Relevant Orders   INR/PT   PT and PTT   Factor 5 leiden   CBC With Diff/Platelet   Comprehensive metabolic panel   Hemoglobin A1c    Return if symptoms worsen or fail to improve.   Laurie Keeler, DNP, AGNP-c 10/05/2021  3:23 PM

## 2021-10-05 NOTE — Assessment & Plan Note (Signed)
Preoperative evaluation today.  Exam normal with no alarm symptoms present at this time.  We will obtain labs for further evaluation and monitoring.  At this time approval for surgery has been provided pending lab results.

## 2021-10-05 NOTE — Patient Instructions (Signed)
I am so proud of you!! I am so excited for your new part of your journey!!

## 2021-10-11 LAB — COMPREHENSIVE METABOLIC PANEL
ALT: 9 IU/L (ref 0–32)
AST: 13 IU/L (ref 0–40)
Albumin/Globulin Ratio: 1.7 (ref 1.2–2.2)
Albumin: 4.3 g/dL (ref 3.9–4.9)
Alkaline Phosphatase: 79 IU/L (ref 44–121)
BUN/Creatinine Ratio: 13 (ref 9–23)
BUN: 10 mg/dL (ref 6–24)
Bilirubin Total: 0.3 mg/dL (ref 0.0–1.2)
CO2: 21 mmol/L (ref 20–29)
Calcium: 9.3 mg/dL (ref 8.7–10.2)
Chloride: 103 mmol/L (ref 96–106)
Creatinine, Ser: 0.75 mg/dL (ref 0.57–1.00)
Globulin, Total: 2.6 g/dL (ref 1.5–4.5)
Glucose: 78 mg/dL (ref 70–99)
Potassium: 3.5 mmol/L (ref 3.5–5.2)
Sodium: 142 mmol/L (ref 134–144)
Total Protein: 6.9 g/dL (ref 6.0–8.5)
eGFR: 103 mL/min/{1.73_m2} (ref >59)

## 2021-10-11 LAB — CBC WITH DIFF/PLATELET
Basophils Absolute: 0.1 10*3/uL (ref 0.0–0.2)
Basos: 1 %
EOS (ABSOLUTE): 0.5 10*3/uL — ABNORMAL HIGH (ref 0.0–0.4)
Eos: 4 %
Hematocrit: 44.2 % (ref 34.0–46.6)
Hemoglobin: 14.4 g/dL (ref 11.1–15.9)
Immature Grans (Abs): 0 10*3/uL (ref 0.0–0.1)
Immature Granulocytes: 0 %
Lymphocytes Absolute: 2.4 10*3/uL (ref 0.7–3.1)
Lymphs: 22 %
MCH: 30.4 pg (ref 26.6–33.0)
MCHC: 32.6 g/dL (ref 31.5–35.7)
MCV: 93 fL (ref 79–97)
Monocytes Absolute: 0.6 10*3/uL (ref 0.1–0.9)
Monocytes: 5 %
Neutrophils Absolute: 7.3 10*3/uL — ABNORMAL HIGH (ref 1.4–7.0)
Neutrophils: 68 %
Platelets: 364 10*3/uL (ref 150–450)
RBC: 4.74 x10E6/uL (ref 3.77–5.28)
RDW: 12.1 % (ref 11.7–15.4)
WBC: 10.8 10*3/uL (ref 3.4–10.8)

## 2021-10-11 LAB — FACTOR 5 LEIDEN

## 2021-10-11 LAB — HEMOGLOBIN A1C
Est. average glucose Bld gHb Est-mCnc: 91 mg/dL
Hgb A1c MFr Bld: 4.8 % (ref 4.8–5.6)

## 2021-10-11 LAB — PT AND PTT
INR: 0.9 (ref 0.9–1.2)
Prothrombin Time: 9.8 s (ref 9.1–12.0)
aPTT: 27 s (ref 24–33)

## 2021-10-15 ENCOUNTER — Other Ambulatory Visit (HOSPITAL_BASED_OUTPATIENT_CLINIC_OR_DEPARTMENT_OTHER): Payer: Self-pay

## 2021-10-15 ENCOUNTER — Other Ambulatory Visit (HOSPITAL_COMMUNITY): Payer: Self-pay

## 2021-10-15 DIAGNOSIS — E6609 Other obesity due to excess calories: Secondary | ICD-10-CM

## 2021-10-15 MED ORDER — WEGOVY 1.7 MG/0.75ML ~~LOC~~ SOAJ
1.7000 mg | SUBCUTANEOUS | 0 refills | Status: AC
  Filled 2021-10-15: qty 3, 28d supply, fill #0

## 2021-10-19 ENCOUNTER — Other Ambulatory Visit (HOSPITAL_COMMUNITY): Payer: Self-pay

## 2021-10-19 ENCOUNTER — Other Ambulatory Visit (HOSPITAL_BASED_OUTPATIENT_CLINIC_OR_DEPARTMENT_OTHER): Payer: Self-pay

## 2021-10-20 ENCOUNTER — Other Ambulatory Visit (HOSPITAL_COMMUNITY): Payer: Self-pay

## 2021-10-26 ENCOUNTER — Other Ambulatory Visit (HOSPITAL_COMMUNITY): Payer: Self-pay

## 2021-10-26 MED ORDER — HYDROCODONE-ACETAMINOPHEN 5-325 MG PO TABS
1.0000 | ORAL_TABLET | Freq: Four times a day (QID) | ORAL | 0 refills | Status: DC | PRN
  Filled 2021-10-26: qty 25, 3d supply, fill #0

## 2021-10-26 MED ORDER — MONTELUKAST SODIUM 10 MG PO TABS
10.0000 mg | ORAL_TABLET | Freq: Every day | ORAL | 0 refills | Status: DC
  Filled 2021-10-26: qty 90, 90d supply, fill #0

## 2021-10-26 MED ORDER — DIAZEPAM 5 MG PO TABS
5.0000 mg | ORAL_TABLET | Freq: Three times a day (TID) | ORAL | 0 refills | Status: DC | PRN
  Filled 2021-10-26: qty 25, 9d supply, fill #0

## 2021-10-26 MED ORDER — CEPHALEXIN 500 MG PO CAPS
500.0000 mg | ORAL_CAPSULE | Freq: Three times a day (TID) | ORAL | 1 refills | Status: DC
  Filled 2021-10-26: qty 15, 5d supply, fill #0

## 2021-11-03 HISTORY — PX: AUGMENTATION MAMMAPLASTY: SUR837

## 2021-11-28 ENCOUNTER — Encounter: Payer: Self-pay | Admitting: Nurse Practitioner

## 2021-11-28 DIAGNOSIS — E6609 Other obesity due to excess calories: Secondary | ICD-10-CM

## 2021-11-29 MED ORDER — WEGOVY 1.7 MG/0.75ML ~~LOC~~ SOAJ
1.7000 mg | SUBCUTANEOUS | 3 refills | Status: DC
  Filled 2021-11-29: qty 3, 28d supply, fill #0
  Filled 2022-01-05 – 2022-01-08 (×2): qty 3, 28d supply, fill #1

## 2021-11-30 ENCOUNTER — Other Ambulatory Visit (HOSPITAL_COMMUNITY): Payer: Self-pay

## 2021-12-03 ENCOUNTER — Other Ambulatory Visit (HOSPITAL_COMMUNITY): Payer: Self-pay

## 2021-12-09 ENCOUNTER — Other Ambulatory Visit (HOSPITAL_BASED_OUTPATIENT_CLINIC_OR_DEPARTMENT_OTHER): Payer: Self-pay | Admitting: Nurse Practitioner

## 2021-12-09 ENCOUNTER — Other Ambulatory Visit (HOSPITAL_COMMUNITY): Payer: Self-pay

## 2021-12-09 DIAGNOSIS — F900 Attention-deficit hyperactivity disorder, predominantly inattentive type: Secondary | ICD-10-CM

## 2021-12-10 ENCOUNTER — Other Ambulatory Visit (HOSPITAL_COMMUNITY): Payer: Self-pay

## 2021-12-10 MED ORDER — AMPHETAMINE-DEXTROAMPHET ER 15 MG PO CP24
15.0000 mg | ORAL_CAPSULE | ORAL | 0 refills | Status: DC
  Filled 2021-12-10: qty 30, 30d supply, fill #0

## 2021-12-23 ENCOUNTER — Other Ambulatory Visit: Payer: Self-pay

## 2021-12-24 ENCOUNTER — Other Ambulatory Visit (HOSPITAL_COMMUNITY): Payer: Self-pay

## 2021-12-24 MED ORDER — BIMATOPROST 0.03 % EX SOLN
CUTANEOUS | 6 refills | Status: DC
  Filled 2021-12-24: qty 3, 30d supply, fill #0

## 2021-12-28 ENCOUNTER — Other Ambulatory Visit (HOSPITAL_COMMUNITY): Payer: Self-pay

## 2021-12-29 ENCOUNTER — Other Ambulatory Visit (HOSPITAL_COMMUNITY): Payer: Self-pay

## 2022-01-01 ENCOUNTER — Other Ambulatory Visit (HOSPITAL_COMMUNITY): Payer: Self-pay

## 2022-01-05 ENCOUNTER — Other Ambulatory Visit (HOSPITAL_COMMUNITY): Payer: Self-pay

## 2022-01-08 ENCOUNTER — Other Ambulatory Visit (HOSPITAL_COMMUNITY): Payer: Self-pay

## 2022-01-11 ENCOUNTER — Encounter: Payer: Self-pay | Admitting: Nurse Practitioner

## 2022-01-11 ENCOUNTER — Other Ambulatory Visit (HOSPITAL_COMMUNITY): Payer: Self-pay

## 2022-01-11 DIAGNOSIS — I10 Essential (primary) hypertension: Secondary | ICD-10-CM

## 2022-01-11 DIAGNOSIS — E6609 Other obesity due to excess calories: Secondary | ICD-10-CM

## 2022-01-12 ENCOUNTER — Other Ambulatory Visit (HOSPITAL_COMMUNITY): Payer: Self-pay

## 2022-01-12 MED ORDER — WEGOVY 2.4 MG/0.75ML ~~LOC~~ SOAJ
2.4000 mg | SUBCUTANEOUS | 3 refills | Status: DC
  Filled 2022-01-12: qty 3, 28d supply, fill #0
  Filled 2022-02-16: qty 3, 28d supply, fill #1
  Filled 2022-03-10 – 2022-03-11 (×2): qty 3, 28d supply, fill #2
  Filled 2022-03-11 – 2022-04-04 (×2): qty 3, 28d supply, fill #3

## 2022-01-13 ENCOUNTER — Other Ambulatory Visit (HOSPITAL_COMMUNITY): Payer: Self-pay

## 2022-01-21 ENCOUNTER — Other Ambulatory Visit (HOSPITAL_BASED_OUTPATIENT_CLINIC_OR_DEPARTMENT_OTHER): Payer: Self-pay | Admitting: Family Medicine

## 2022-01-21 ENCOUNTER — Other Ambulatory Visit (HOSPITAL_COMMUNITY): Payer: Self-pay

## 2022-01-21 DIAGNOSIS — F900 Attention-deficit hyperactivity disorder, predominantly inattentive type: Secondary | ICD-10-CM

## 2022-01-24 ENCOUNTER — Other Ambulatory Visit (HOSPITAL_COMMUNITY): Payer: Self-pay

## 2022-01-24 MED ORDER — AMPHETAMINE-DEXTROAMPHET ER 15 MG PO CP24
15.0000 mg | ORAL_CAPSULE | ORAL | 0 refills | Status: DC
  Filled 2022-01-24: qty 30, 30d supply, fill #0

## 2022-02-16 ENCOUNTER — Other Ambulatory Visit (HOSPITAL_COMMUNITY): Payer: Self-pay

## 2022-02-28 ENCOUNTER — Other Ambulatory Visit (HOSPITAL_COMMUNITY): Payer: Self-pay

## 2022-03-02 ENCOUNTER — Encounter: Payer: Self-pay | Admitting: Nurse Practitioner

## 2022-03-02 DIAGNOSIS — Z308 Encounter for other contraceptive management: Secondary | ICD-10-CM

## 2022-03-02 MED ORDER — ETONOGESTREL-ETHINYL ESTRADIOL 0.12-0.015 MG/24HR VA RING
VAGINAL_RING | VAGINAL | 2 refills | Status: DC
  Filled 2022-07-29: qty 3, 28d supply, fill #0
  Filled 2022-08-08: qty 3, 63d supply, fill #0
  Filled 2022-09-17: qty 3, 63d supply, fill #1

## 2022-03-08 ENCOUNTER — Other Ambulatory Visit (HOSPITAL_COMMUNITY): Payer: Self-pay

## 2022-03-10 ENCOUNTER — Other Ambulatory Visit (HOSPITAL_BASED_OUTPATIENT_CLINIC_OR_DEPARTMENT_OTHER): Payer: Self-pay

## 2022-03-11 ENCOUNTER — Other Ambulatory Visit (HOSPITAL_BASED_OUTPATIENT_CLINIC_OR_DEPARTMENT_OTHER): Payer: Self-pay

## 2022-03-28 ENCOUNTER — Other Ambulatory Visit (HOSPITAL_COMMUNITY): Payer: Self-pay

## 2022-04-01 ENCOUNTER — Encounter: Payer: Self-pay | Admitting: Nurse Practitioner

## 2022-04-01 DIAGNOSIS — F900 Attention-deficit hyperactivity disorder, predominantly inattentive type: Secondary | ICD-10-CM

## 2022-04-04 ENCOUNTER — Other Ambulatory Visit (HOSPITAL_COMMUNITY): Payer: Self-pay

## 2022-04-06 MED ORDER — LISDEXAMFETAMINE DIMESYLATE 20 MG PO CAPS: 20.0000 mg | ORAL_CAPSULE | Freq: Every day | ORAL | 0 refills | Status: DC

## 2022-04-07 ENCOUNTER — Other Ambulatory Visit (HOSPITAL_COMMUNITY): Payer: Self-pay

## 2022-04-15 ENCOUNTER — Other Ambulatory Visit (HOSPITAL_COMMUNITY): Payer: Self-pay

## 2022-04-21 ENCOUNTER — Other Ambulatory Visit (HOSPITAL_BASED_OUTPATIENT_CLINIC_OR_DEPARTMENT_OTHER): Payer: Self-pay | Admitting: Family Medicine

## 2022-04-21 ENCOUNTER — Other Ambulatory Visit (HOSPITAL_COMMUNITY): Payer: Self-pay

## 2022-04-21 ENCOUNTER — Other Ambulatory Visit (HOSPITAL_BASED_OUTPATIENT_CLINIC_OR_DEPARTMENT_OTHER): Payer: Self-pay | Admitting: Nurse Practitioner

## 2022-04-21 DIAGNOSIS — F900 Attention-deficit hyperactivity disorder, predominantly inattentive type: Secondary | ICD-10-CM

## 2022-04-21 NOTE — Telephone Encounter (Signed)
Refill request last apt 10/05/21 I will send pt. MyChart message to schedule a future apt.

## 2022-04-22 ENCOUNTER — Other Ambulatory Visit (HOSPITAL_COMMUNITY): Payer: Self-pay

## 2022-04-22 MED ORDER — SERTRALINE HCL 50 MG PO TABS
50.0000 mg | ORAL_TABLET | Freq: Every day | ORAL | 3 refills | Status: DC
Start: 2022-04-22 — End: 2022-10-20
  Filled 2022-04-22: qty 90, 90d supply, fill #0
  Filled 2022-08-16: qty 90, 90d supply, fill #1

## 2022-04-22 MED ORDER — BUPROPION HCL ER (XL) 150 MG PO TB24
150.0000 mg | ORAL_TABLET | Freq: Every day | ORAL | 0 refills | Status: DC
  Filled 2022-04-22: qty 90, 90d supply, fill #0

## 2022-04-28 ENCOUNTER — Encounter: Payer: Self-pay | Admitting: Nurse Practitioner

## 2022-04-28 DIAGNOSIS — F900 Attention-deficit hyperactivity disorder, predominantly inattentive type: Secondary | ICD-10-CM

## 2022-05-02 ENCOUNTER — Other Ambulatory Visit (HOSPITAL_COMMUNITY): Payer: Self-pay

## 2022-05-02 MED ORDER — LISDEXAMFETAMINE DIMESYLATE 20 MG PO CAPS
20.0000 mg | ORAL_CAPSULE | Freq: Every day | ORAL | 0 refills | Status: DC
  Filled 2022-05-02: qty 30, 30d supply, fill #0

## 2022-05-04 ENCOUNTER — Other Ambulatory Visit (HOSPITAL_COMMUNITY): Payer: Self-pay

## 2022-05-05 ENCOUNTER — Other Ambulatory Visit: Payer: Self-pay

## 2022-05-05 ENCOUNTER — Other Ambulatory Visit (HOSPITAL_COMMUNITY): Payer: Self-pay

## 2022-05-21 ENCOUNTER — Other Ambulatory Visit (HOSPITAL_COMMUNITY): Payer: Self-pay

## 2022-05-24 ENCOUNTER — Other Ambulatory Visit: Payer: Self-pay | Admitting: Nurse Practitioner

## 2022-05-24 DIAGNOSIS — Z1231 Encounter for screening mammogram for malignant neoplasm of breast: Secondary | ICD-10-CM

## 2022-05-27 ENCOUNTER — Other Ambulatory Visit (HOSPITAL_COMMUNITY): Payer: Self-pay

## 2022-06-01 ENCOUNTER — Other Ambulatory Visit (HOSPITAL_COMMUNITY): Payer: Self-pay

## 2022-06-01 ENCOUNTER — Other Ambulatory Visit: Payer: Self-pay | Admitting: Nurse Practitioner

## 2022-06-01 ENCOUNTER — Other Ambulatory Visit (HOSPITAL_BASED_OUTPATIENT_CLINIC_OR_DEPARTMENT_OTHER): Payer: Self-pay | Admitting: Nurse Practitioner

## 2022-06-01 DIAGNOSIS — G43909 Migraine, unspecified, not intractable, without status migrainosus: Secondary | ICD-10-CM

## 2022-06-01 DIAGNOSIS — R110 Nausea: Secondary | ICD-10-CM

## 2022-06-01 DIAGNOSIS — F900 Attention-deficit hyperactivity disorder, predominantly inattentive type: Secondary | ICD-10-CM

## 2022-06-01 MED ORDER — SPIRONOLACTONE 25 MG PO TABS
25.0000 mg | ORAL_TABLET | Freq: Two times a day (BID) | ORAL | 3 refills | Status: DC
  Filled 2022-06-01: qty 60, 30d supply, fill #0

## 2022-06-01 MED ORDER — ONDANSETRON 4 MG PO TBDP
4.0000 mg | ORAL_TABLET | Freq: Three times a day (TID) | ORAL | 1 refills | Status: DC | PRN
  Filled 2022-06-01: qty 30, 10d supply, fill #0
  Filled 2022-08-16: qty 30, 10d supply, fill #1

## 2022-06-01 NOTE — Telephone Encounter (Signed)
Refill request last apt 10/25/21. 

## 2022-06-02 ENCOUNTER — Other Ambulatory Visit: Payer: Self-pay

## 2022-06-02 ENCOUNTER — Encounter: Payer: Self-pay | Admitting: Nurse Practitioner

## 2022-06-02 ENCOUNTER — Other Ambulatory Visit (HOSPITAL_COMMUNITY): Payer: Self-pay

## 2022-06-02 DIAGNOSIS — F900 Attention-deficit hyperactivity disorder, predominantly inattentive type: Secondary | ICD-10-CM

## 2022-06-02 MED ORDER — LISDEXAMFETAMINE DIMESYLATE 20 MG PO CAPS
20.0000 mg | ORAL_CAPSULE | Freq: Every day | ORAL | 0 refills | Status: DC
Start: 2022-06-02 — End: 2022-06-03
  Filled 2022-06-02: qty 30, 30d supply, fill #0

## 2022-06-03 ENCOUNTER — Other Ambulatory Visit (HOSPITAL_COMMUNITY): Payer: Self-pay

## 2022-06-03 MED ORDER — LISDEXAMFETAMINE DIMESYLATE 40 MG PO CAPS
40.0000 mg | ORAL_CAPSULE | ORAL | 0 refills | Status: DC
Start: 2022-06-03 — End: 2022-09-19
  Filled 2022-06-03 – 2022-07-02 (×2): qty 30, 30d supply, fill #0

## 2022-06-04 ENCOUNTER — Other Ambulatory Visit: Payer: Self-pay

## 2022-06-06 ENCOUNTER — Other Ambulatory Visit: Payer: Self-pay

## 2022-06-06 ENCOUNTER — Other Ambulatory Visit (HOSPITAL_COMMUNITY): Payer: Self-pay

## 2022-06-07 ENCOUNTER — Other Ambulatory Visit (HOSPITAL_COMMUNITY): Payer: Self-pay

## 2022-06-07 ENCOUNTER — Other Ambulatory Visit: Payer: Self-pay

## 2022-06-10 ENCOUNTER — Other Ambulatory Visit: Payer: Self-pay

## 2022-06-20 ENCOUNTER — Other Ambulatory Visit (HOSPITAL_COMMUNITY): Payer: Self-pay

## 2022-06-27 ENCOUNTER — Other Ambulatory Visit: Payer: Self-pay | Admitting: Nurse Practitioner

## 2022-06-27 ENCOUNTER — Ambulatory Visit
Admission: RE | Admit: 2022-06-27 | Discharge: 2022-06-27 | Disposition: A | Discharge status: Home / Self Care | Payer: Commercial Managed Care - PPO | Source: Ambulatory Visit | Attending: Nurse Practitioner | Admitting: Nurse Practitioner

## 2022-06-27 DIAGNOSIS — Z1231 Encounter for screening mammogram for malignant neoplasm of breast: Secondary | ICD-10-CM | POA: Diagnosis not present

## 2022-07-02 ENCOUNTER — Other Ambulatory Visit (HOSPITAL_COMMUNITY): Payer: Self-pay

## 2022-07-04 ENCOUNTER — Other Ambulatory Visit: Payer: Self-pay

## 2022-07-16 ENCOUNTER — Telehealth: Payer: Commercial Managed Care - PPO | Admitting: Family Medicine

## 2022-07-16 DIAGNOSIS — R319 Hematuria, unspecified: Secondary | ICD-10-CM

## 2022-07-16 DIAGNOSIS — N390 Urinary tract infection, site not specified: Secondary | ICD-10-CM | POA: Diagnosis not present

## 2022-07-16 MED ORDER — CEPHALEXIN 500 MG PO CAPS: 500.0000 mg | ORAL_CAPSULE | Freq: Two times a day (BID) | ORAL | 0 refills | Status: AC

## 2022-07-16 NOTE — Progress Notes (Signed)

## 2022-07-21 ENCOUNTER — Ambulatory Visit: Payer: Commercial Managed Care - PPO | Admitting: Obstetrics & Gynecology

## 2022-07-29 ENCOUNTER — Other Ambulatory Visit (HOSPITAL_COMMUNITY): Payer: Self-pay

## 2022-08-05 ENCOUNTER — Other Ambulatory Visit (HOSPITAL_COMMUNITY): Payer: Self-pay

## 2022-08-08 ENCOUNTER — Other Ambulatory Visit: Payer: Self-pay | Admitting: Oncology

## 2022-08-08 ENCOUNTER — Other Ambulatory Visit (HOSPITAL_COMMUNITY): Payer: Self-pay

## 2022-08-08 DIAGNOSIS — Z006 Encounter for examination for normal comparison and control in clinical research program: Secondary | ICD-10-CM

## 2022-08-16 ENCOUNTER — Other Ambulatory Visit (HOSPITAL_BASED_OUTPATIENT_CLINIC_OR_DEPARTMENT_OTHER): Payer: Self-pay | Admitting: Nurse Practitioner

## 2022-08-16 ENCOUNTER — Other Ambulatory Visit (HOSPITAL_COMMUNITY): Payer: Self-pay

## 2022-08-16 NOTE — Telephone Encounter (Signed)
Refill request last apt 10/05/21 next apt 10/20/22.

## 2022-08-18 MED ORDER — BUPROPION HCL ER (XL) 150 MG PO TB24
150.0000 mg | ORAL_TABLET | Freq: Every day | ORAL | 0 refills | Status: DC
  Filled 2022-08-18: qty 90, 90d supply, fill #0

## 2022-08-19 ENCOUNTER — Other Ambulatory Visit (HOSPITAL_COMMUNITY): Payer: Self-pay

## 2022-08-23 ENCOUNTER — Other Ambulatory Visit (HOSPITAL_COMMUNITY): Payer: Self-pay

## 2022-09-07 ENCOUNTER — Ambulatory Visit: Payer: Commercial Managed Care - PPO | Admitting: Obstetrics and Gynecology

## 2022-09-17 ENCOUNTER — Other Ambulatory Visit (HOSPITAL_BASED_OUTPATIENT_CLINIC_OR_DEPARTMENT_OTHER): Payer: Self-pay | Admitting: Nurse Practitioner

## 2022-09-17 ENCOUNTER — Other Ambulatory Visit (HOSPITAL_COMMUNITY): Payer: Self-pay

## 2022-09-17 DIAGNOSIS — G43909 Migraine, unspecified, not intractable, without status migrainosus: Secondary | ICD-10-CM

## 2022-09-19 ENCOUNTER — Other Ambulatory Visit (HOSPITAL_COMMUNITY): Payer: Self-pay

## 2022-09-19 ENCOUNTER — Other Ambulatory Visit: Payer: Self-pay | Admitting: Nurse Practitioner

## 2022-09-19 DIAGNOSIS — F900 Attention-deficit hyperactivity disorder, predominantly inattentive type: Secondary | ICD-10-CM

## 2022-09-19 MED ORDER — SUMATRIPTAN SUCCINATE 25 MG PO TABS
25.0000 mg | ORAL_TABLET | Freq: Every day | ORAL | 5 refills | Status: DC | PRN
  Filled 2022-09-19: qty 18, 30d supply, fill #0
  Filled 2023-01-17: qty 18, 30d supply, fill #1
  Filled 2023-03-20: qty 18, 30d supply, fill #2
  Filled 2023-05-22: qty 18, 30d supply, fill #3

## 2022-09-19 NOTE — Telephone Encounter (Signed)
Last apt 10/05/21 next apt 10/20/22.

## 2022-09-20 ENCOUNTER — Other Ambulatory Visit (HOSPITAL_COMMUNITY): Payer: Self-pay

## 2022-09-20 MED ORDER — LISDEXAMFETAMINE DIMESYLATE 40 MG PO CAPS
40.0000 mg | ORAL_CAPSULE | ORAL | 0 refills | Status: DC
  Filled 2022-09-20: qty 30, 30d supply, fill #0

## 2022-09-23 ENCOUNTER — Other Ambulatory Visit (HOSPITAL_COMMUNITY): Payer: Self-pay

## 2022-09-28 ENCOUNTER — Other Ambulatory Visit (HOSPITAL_BASED_OUTPATIENT_CLINIC_OR_DEPARTMENT_OTHER): Payer: Self-pay | Admitting: Nurse Practitioner

## 2022-09-28 ENCOUNTER — Other Ambulatory Visit (HOSPITAL_COMMUNITY): Payer: Self-pay

## 2022-09-28 DIAGNOSIS — G43909 Migraine, unspecified, not intractable, without status migrainosus: Secondary | ICD-10-CM

## 2022-09-28 DIAGNOSIS — R110 Nausea: Secondary | ICD-10-CM

## 2022-09-28 MED ORDER — ONDANSETRON 4 MG PO TBDP
4.0000 mg | ORAL_TABLET | Freq: Three times a day (TID) | ORAL | 0 refills | Status: DC | PRN
  Filled 2022-09-28: qty 30, 10d supply, fill #0

## 2022-09-29 ENCOUNTER — Other Ambulatory Visit (HOSPITAL_COMMUNITY): Payer: Self-pay

## 2022-10-04 ENCOUNTER — Other Ambulatory Visit (HOSPITAL_COMMUNITY): Payer: Self-pay

## 2022-10-04 DIAGNOSIS — Z780 Asymptomatic menopausal state: Secondary | ICD-10-CM | POA: Diagnosis not present

## 2022-10-04 DIAGNOSIS — Z130 Encounter for screening for diseases of the blood and blood-forming organs and certain disorders involving the immune mechanism: Secondary | ICD-10-CM | POA: Diagnosis not present

## 2022-10-04 DIAGNOSIS — Z1389 Encounter for screening for other disorder: Secondary | ICD-10-CM | POA: Diagnosis not present

## 2022-10-04 DIAGNOSIS — Z01419 Encounter for gynecological examination (general) (routine) without abnormal findings: Secondary | ICD-10-CM | POA: Diagnosis not present

## 2022-10-04 DIAGNOSIS — Z113 Encounter for screening for infections with a predominantly sexual mode of transmission: Secondary | ICD-10-CM | POA: Diagnosis not present

## 2022-10-04 DIAGNOSIS — Z1151 Encounter for screening for human papillomavirus (HPV): Secondary | ICD-10-CM | POA: Diagnosis not present

## 2022-10-04 DIAGNOSIS — Z3040 Encounter for surveillance of contraceptives, unspecified: Secondary | ICD-10-CM | POA: Diagnosis not present

## 2022-10-04 DIAGNOSIS — Z124 Encounter for screening for malignant neoplasm of cervix: Secondary | ICD-10-CM | POA: Diagnosis not present

## 2022-10-04 LAB — RESULTS CONSOLE HPV: CHL HPV: NEGATIVE

## 2022-10-04 LAB — HM PAP SMEAR: HM Pap smear: NEGATIVE

## 2022-10-04 MED ORDER — ETONOGESTREL-ETHINYL ESTRADIOL 0.12-0.015 MG/24HR VA RING
VAGINAL_RING | VAGINAL | 4 refills | Status: DC
  Filled 2022-10-04: qty 3, 84d supply, fill #0
  Filled 2023-01-07: qty 3, 84d supply, fill #1
  Filled 2023-03-20: qty 3, 84d supply, fill #2
  Filled 2023-04-11: qty 3, 63d supply, fill #3
  Filled 2023-05-22 – 2023-07-10 (×2): qty 3, 84d supply, fill #3
  Filled 2023-09-08: qty 3, 84d supply, fill #4

## 2022-10-07 ENCOUNTER — Other Ambulatory Visit (HOSPITAL_COMMUNITY): Payer: Self-pay

## 2022-10-14 ENCOUNTER — Encounter: Payer: Self-pay | Admitting: Nurse Practitioner

## 2022-10-20 ENCOUNTER — Encounter: Payer: Self-pay | Admitting: Nurse Practitioner

## 2022-10-20 ENCOUNTER — Other Ambulatory Visit (HOSPITAL_COMMUNITY): Payer: Self-pay

## 2022-10-20 ENCOUNTER — Ambulatory Visit: Payer: Commercial Managed Care - PPO | Admitting: Nurse Practitioner

## 2022-10-20 VITALS — BP 122/78 | HR 68 | Ht 66.5 in | Wt 152.6 lb

## 2022-10-20 DIAGNOSIS — Z0000 Encounter for general adult medical examination without abnormal findings: Secondary | ICD-10-CM

## 2022-10-20 DIAGNOSIS — I10 Essential (primary) hypertension: Secondary | ICD-10-CM | POA: Diagnosis not present

## 2022-10-20 DIAGNOSIS — R002 Palpitations: Secondary | ICD-10-CM

## 2022-10-20 DIAGNOSIS — F418 Other specified anxiety disorders: Secondary | ICD-10-CM | POA: Diagnosis not present

## 2022-10-20 DIAGNOSIS — K5120 Ulcerative (chronic) proctitis without complications: Secondary | ICD-10-CM | POA: Insufficient documentation

## 2022-10-20 DIAGNOSIS — F900 Attention-deficit hyperactivity disorder, predominantly inattentive type: Secondary | ICD-10-CM

## 2022-10-20 MED ORDER — SERTRALINE HCL 50 MG PO TABS
50.0000 mg | ORAL_TABLET | Freq: Every day | ORAL | 3 refills | Status: DC
  Filled 2022-10-20 – 2022-12-12 (×2): qty 90, 90d supply, fill #0
  Filled 2023-03-20: qty 90, 90d supply, fill #1
  Filled 2023-08-11: qty 90, 90d supply, fill #2
  Filled 2023-09-08 – 2023-10-16 (×2): qty 90, 90d supply, fill #3

## 2022-10-20 MED ORDER — LISDEXAMFETAMINE DIMESYLATE 40 MG PO CAPS
40.0000 mg | ORAL_CAPSULE | ORAL | 0 refills | Status: DC
Start: 2022-11-17 — End: 2023-03-01
  Filled 2023-01-05: qty 30, 30d supply, fill #0

## 2022-10-20 MED ORDER — BUPROPION HCL ER (XL) 150 MG PO TB24
150.0000 mg | ORAL_TABLET | Freq: Every day | ORAL | 0 refills | Status: DC
  Filled 2022-10-20 – 2022-12-05 (×2): qty 90, 90d supply, fill #0

## 2022-10-20 MED ORDER — LISDEXAMFETAMINE DIMESYLATE 40 MG PO CAPS
40.0000 mg | ORAL_CAPSULE | ORAL | 0 refills | Status: DC
Start: 2022-10-20 — End: 2023-09-08
  Filled 2022-10-20 – 2022-11-07 (×2): qty 30, 30d supply, fill #0

## 2022-10-20 MED ORDER — BIMATOPROST 0.03 % EX SOLN
CUTANEOUS | 6 refills | Status: DC
Start: 2022-10-20 — End: 2023-11-07
  Filled 2022-10-20 – 2023-03-20 (×2): qty 3, 30d supply, fill #0

## 2022-10-20 NOTE — Progress Notes (Signed)
Shawna Clamp, DNP, AGNP-c [facility] [address] [place], [place] [address] Main Office [phone]  BP 122/78   Pulse 68   Ht 5' 6.5" (1.689 m)   Wt 152 lb 9.6 oz (69.2 kg)   BMI 24.26 kg/m    Subjective:    Patient ID: Laurie [name], female    DOB: 1980-03-13, 42 y.o.   MRN: [id number]  HPI: Laurie [name] is a 42 y.o. female presenting on 10/20/2022 for comprehensive medical examination.   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 The palpitations are more noticeable during stressful work situations and are not associated with any other symptoms such as dizziness, diaphoresis, shortness of breath, or chest pain. The patient manages these episodes with leftover Metoprolol as needed.  The patient also reports changes in vision, particularly difficulty adjusting to changes in light and dark, which she attributes to aging. She has no concerns with her hearing.  The patient has a history of hormonal acne, for which she previously took Spironolactone but has since discontinued due to concerns about blocking estrogen as she enters perimenopause. She reports no significant changes in her skin since discontinuing the medication.  The patient also has a history of depression, for which she takes Sertraline. She attempted to titrate off this medication but experienced significant irritability and has since resumed her regular dose.  The patient has been using Semaglutide, obtained from a peptide therapy website, for weight management. She reports successful weight maintenance with this regimen.  The patient underwent breast augmentation surgery recently and reports satisfaction with the results. She took Montelukast for a month postoperatively to prevent capsular contracture, as  recommended by her surgeon.  The patient also uses Latisse intermittently for eyelash growth and requests a refill. She reports no issues with sleep, falling asleep easily when tired, and has no concerns with swallowing or any fullness in the throat.  Pertinent items are noted in HPI.  IMMUNIZATIONS:   Flu Vaccine: Flu vaccine completed elsewhere this season. Record updated. Prevnar 13: Prevnar 13 N/A for this patient Prevnar 20: Prevnar 20 N/A for this patient Pneumovax 23: Pneumovax 23 N/A for this patient Vac Shingrix: Shingrix N/A for this patient HPV: N/A or Aged Out Tetanus: Tetanus completed in the last 10 years COVID: COVID completed, documentation in chart  RSV: No  HEALTH MAINTENANCE: Pap Smear HM Status: is up to date Mammogram HM Status: is up to date Colon Cancer Screening HM Status: is up to date Bone Density HM Status: N/A STI Testing HM Status: N/A Lung CT HM Status: N/A  Concerns with vision, hearing, or dentition: No  Most Recent Depression Screen:     10/20/2022    2:28 PM 10/05/2021    3:24 PM 03/22/2021    7:28 PM  Depression screen PHQ 2/9  Decreased Interest 0 0 0  Down, Depressed, Hopeless 0 0 0  PHQ - 2 Score 0 0 0  Altered sleeping  1   Tired, decreased energy  1   Change in appetite  0   Feeling bad or failure about yourself   0   Trouble concentrating  0   Moving slowly or fidgety/restless  0   Suicidal thoughts  0   PHQ-9 Score  2   Difficult doing work/chores  Not difficult at all    Most Recent Anxiety Screen:  10/05/2021    3:24 PM  GAD 7 : Generalized Anxiety Score  Nervous, Anxious, on Edge 0  Control/stop worrying 0  Worry too much - different things 0  Trouble relaxing 0  Restless 0  Easily annoyed or irritable 0  Afraid - awful might happen 0  Total GAD 7 Score 0  Anxiety Difficulty Not difficult at all   Most Recent Fall Screen:    10/20/2022    2:28 PM 10/05/2021    3:24 PM 03/22/2021    7:28 PM  Fall Risk    Falls in the past year? 0 0 0  Number falls in past yr: 0 0 0  Injury with Fall? 0 0 0  Risk for fall due to : No Fall Risks No Fall Risks No Fall Risks  Follow up Falls evaluation completed Falls evaluation completed;Education provided Falls evaluation completed    Past medical history, surgical history, medications, allergies, family history and social history reviewed with patient today and changes made to appropriate areas of the chart.  Past Medical History:  Past Medical History:  Diagnosis Date   Dysrhythmia    PVCs in grad school and with pregnancy none recently   Family history of adverse reaction to anesthesia    states twin sister is hard to arouse post-op   Infertility associated with anovulation    Plantar fasciitis of right foot 02/03/2014   Tightness of right heel cord 02/03/2014   Ulcerative colitis (HCC) 2018   Medications:  Current Outpatient Medications on File Prior to Visit  Medication Sig   etonogestrel-ethinyl estradiol (NUVARING) 0.12-0.015 MG/24HR vaginal ring INSERT 1 RING VAGINALLY EVERY 4 WEEKS   SUMAtriptan (IMITREX) 25 MG tablet Take 1 tablet by mouth daily as needed for migraine. May repeat dose 1 time if no improvement after 24 hours. No more than 2 doses in 24 hours.   No current facility-administered medications on file prior to visit.   Surgical History:  Past Surgical History:  Procedure Laterality Date   AUGMENTATION MAMMAPLASTY Bilateral 11/2021   CARPAL TUNNEL RELEASE Left 10/04/2012   Procedure: CARPAL TUNNEL RELEASE;  Surgeon: Tami Ribas, MD;  Location: [facility];  Service: Orthopedics;  Laterality: Left;   CARPAL TUNNEL RELEASE Right 12/24/2012   Procedure: RIGHT CARPAL TUNNEL RELEASE;  Surgeon: Tami Ribas, MD;  Location: [facility];  Service: Orthopedics;  Laterality: Right;   ERCP  01/04/2007   with sphincterotomy   GASTROC RECESSION EXTREMITY Right 02/13/2014   Procedure: RIGHT GASTROCNEMIUS  RECESSION;  Surgeon: Toni Arthurs, MD;  Location: [facility];  Service: Orthopedics;  Laterality: Right;   MASS EXCISION Left 12/18/2012   Procedure: EXCISION OF LEFT POSTERIOR  SHOULDER DERMATOFIBROMA;  Surgeon: Cherylynn Ridges, MD;  Location: [facility];  Service: General;  Laterality: Left;   pancreatic stent  01/04/2007   pancreatitis   PLANTAR FASCIA RELEASE Right 02/13/2014   Procedure: RIGHT PLANTAR FASCIA RELEASE;  Surgeon: Toni Arthurs, MD;  Location: [facility];  Service: Orthopedics;  Laterality: Right;   WISDOM TOOTH EXTRACTION     Allergies:  No Known Allergies Family History:  Family History  Problem Relation Age of Onset   Hypertension Mother    Breast cancer Mother 62       dcis   Hyperlipidemia Father    Heart Problems Father    COPD Maternal Grandmother    Stroke Maternal Grandfather    Heart attack Paternal Grandfather  Objective:    BP 122/78   Pulse 68   Ht 5' 6.5" (1.689 m)   Wt 152 lb 9.6 oz (69.2 kg)   BMI 24.26 kg/m   Wt Readings from Last 3 Encounters:  10/20/22 152 lb 9.6 oz (69.2 kg)  10/05/21 155 lb (70.3 kg)  03/12/21 182 lb (82.6 kg)    Physical Exam Vitals and nursing note reviewed.  Constitutional:      General: She is not in acute distress.    Appearance: Normal appearance.  HENT:     Head: Normocephalic and atraumatic.     Right Ear: Hearing, tympanic membrane, ear canal and external ear normal.     Left Ear: Hearing, tympanic membrane, ear canal and external ear normal.     Nose: Nose normal.     Right Sinus: No maxillary sinus tenderness or frontal sinus tenderness.     Left Sinus: No maxillary sinus tenderness or frontal sinus tenderness.     Mouth/Throat:     Lips: Pink.     Mouth: Mucous membranes are moist.     Pharynx: Oropharynx is clear.  Eyes:     General: Lids are normal. Vision grossly intact.     Extraocular Movements: Extraocular movements intact.      Conjunctiva/sclera: Conjunctivae normal.     Pupils: Pupils are equal, round, and reactive to light.     Funduscopic exam:    Right eye: Red reflex present.        Left eye: Red reflex present.    Visual Fields: Right eye visual fields normal and left eye visual fields normal.  Neck:     Thyroid: No thyromegaly.     Vascular: No carotid bruit.  Cardiovascular:     Rate and Rhythm: Normal rate and regular rhythm.     Chest Wall: PMI is not displaced.     Pulses: Normal pulses.          Dorsalis pedis pulses are 2+ on the right side and 2+ on the left side.       Posterior tibial pulses are 2+ on the right side and 2+ on the left side.     Heart sounds: Normal heart sounds. No murmur heard. Pulmonary:     Effort: Pulmonary effort is normal. No respiratory distress.     Breath sounds: Normal breath sounds.  Abdominal:     General: Abdomen is flat. Bowel sounds are normal. There is no distension.     Palpations: Abdomen is soft. There is no hepatomegaly, splenomegaly or mass.     Tenderness: There is no abdominal tenderness. There is no right CVA tenderness, left CVA tenderness, guarding or rebound.  Musculoskeletal:        General: Normal range of motion.     Cervical back: Full passive range of motion without pain, normal range of motion and neck supple. No tenderness.     Right lower leg: No edema.     Left lower leg: No edema.  Feet:     Left foot:     Toenail Condition: Left toenails are normal.  Lymphadenopathy:     Cervical: No cervical adenopathy.     Upper Body:     Right upper body: No supraclavicular adenopathy.     Left upper body: No supraclavicular adenopathy.  Skin:    General: Skin is warm and dry.     Capillary Refill: Capillary refill takes less than 2 seconds.     Nails: There is no clubbing.  Neurological:     General: No focal deficit present.     Mental Status: She is alert and oriented to person, place, and time.     GCS: GCS eye subscore is 4. GCS verbal  subscore is 5. GCS motor subscore is 6.     Sensory: Sensation is intact.     Motor: Motor function is intact.     Coordination: Coordination is intact.     Gait: Gait is intact.     Deep Tendon Reflexes: Reflexes are normal and symmetric.  Psychiatric:        Attention and Perception: Attention normal.        Mood and Affect: Mood normal.        Speech: Speech normal.        Behavior: Behavior normal. Behavior is cooperative.        Thought Content: Thought content normal.        Cognition and Laurie: Cognition and Laurie normal.        Judgment: Judgment normal.      Results for orders placed or performed in visit on 10/20/22  CBC with Differential/Platelet  Result Value Ref Range   WBC 7.6 3.4 - 10.8 x10E3/uL   RBC 4.42 3.77 - 5.28 x10E6/uL   Hemoglobin 14.0 11.1 - 15.9 g/dL   Hematocrit 16.0 10.9 - 46.6 %   MCV 94 79 - 97 fL   MCH 31.7 26.6 - 33.0 pg   MCHC 33.7 31.5 - 35.7 g/dL   RDW 32.3 55.7 - 32.2 %   Platelets 316 150 - 450 x10E3/uL   Neutrophils 69 Not Estab. %   Lymphs 19 Not Estab. %   Monocytes 5 Not Estab. %   Eos 6 Not Estab. %   Basos 1 Not Estab. %   Neutrophils Absolute 5.2 1.4 - 7.0 x10E3/uL   Lymphocytes Absolute 1.4 0.7 - 3.1 x10E3/uL   Monocytes Absolute 0.4 0.1 - 0.9 x10E3/uL   EOS (ABSOLUTE) 0.5 (H) 0.0 - 0.4 x10E3/uL   Basophils Absolute 0.1 0.0 - 0.2 x10E3/uL   Immature Granulocytes 0 Not Estab. %   Immature Grans (Abs) 0.0 0.0 - 0.1 x10E3/uL  CMP14+EGFR  Result Value Ref Range   Glucose 117 (H) 70 - 99 mg/dL   BUN 12 6 - 24 mg/dL   Creatinine, Ser 0.25 0.57 - 1.00 mg/dL   eGFR 427 >06 CB/JSE/8.31   BUN/Creatinine Ratio 17 9 - 23   Sodium 141 134 - 144 mmol/L   Potassium 4.1 3.5 - 5.2 mmol/L   Chloride 106 96 - 106 mmol/L   CO2 19 (L) 20 - 29 mmol/L   Calcium 9.2 8.7 - 10.2 mg/dL   Total Protein 6.5 6.0 - 8.5 g/dL   Albumin 3.9 3.9 - 4.9 g/dL   Globulin, Total 2.6 1.5 - 4.5 g/dL   Bilirubin Total 0.3 0.0 - 1.2 mg/dL   Alkaline  Phosphatase 66 44 - 121 IU/L   AST 14 0 - 40 IU/L   ALT 13 0 - 32 IU/L  Lipid panel  Result Value Ref Range   Cholesterol, Total 200 (H) 100 - 199 mg/dL   Triglycerides 82 0 - 149 mg/dL   HDL 87 >51 mg/dL   VLDL Cholesterol Cal 15 5 - 40 mg/dL   LDL Chol Calc (NIH) 98 0 - 99 mg/dL   Chol/HDL Ratio 2.3 0.0 - 4.4 ratio  TSH  Result Value Ref Range   TSH 1.660 0.450 - 4.500 uIU/mL  Hepatitis  C antibody  Result Value Ref Range   Hep C Virus Ab Non Reactive Non Reactive       Assessment & Plan:   Problem List Items Addressed This Visit     Anxiety with depression    Chronic. Stable. No alarm symptoms. Continue wellbutrin.       Relevant Medications   buPROPion (WELLBUTRIN XL) 150 MG 24 hr tablet   sertraline (ZOLOFT) 50 MG tablet   Attention deficit hyperactivity disorder (ADHD), predominantly inattentive type    Vyvanse effective but not taken consistently due to variable work schedule. -Continue Vyvanse as currently prescribed, particularly on work nights.      Relevant Medications   lisdexamfetamine (VYVANSE) 40 MG capsule   lisdexamfetamine (VYVANSE) 40 MG capsule (Start on 11/17/2022)   Essential hypertension    Chronic. Well controlled. No changes      Relevant Orders   CBC with Differential/Platelet (Completed)   CMP14+EGFR (Completed)   Lipid panel (Completed)   TSH (Completed)   Encounter for annual physical exam - Primary    CPE completed today. Review of HM activities and recommendations discussed and provided on AVS. Anticipatory guidance, diet, and exercise recommendations provided. Medications, allergies, and hx reviewed and updated as necessary. Orders placed as listed below.  Plan: - Labs ordered. Will make changes as necessary based on results.  - I will review these results and send recommendations via MyChart or a telephone call.  - F/U with CPE in 1 year or sooner for acute/chronic health needs as directed.        Relevant Medications    buPROPion (WELLBUTRIN XL) 150 MG 24 hr tablet   lisdexamfetamine (VYVANSE) 40 MG capsule   sertraline (ZOLOFT) 50 MG tablet   bimatoprost (LATISSE) 0.03 % ophthalmic solution   lisdexamfetamine (VYVANSE) 40 MG capsule (Start on 11/17/2022)   Other Relevant Orders   CBC with Differential/Platelet (Completed)   CMP14+EGFR (Completed)   Lipid panel (Completed)   TSH (Completed)   Hepatitis C antibody (Completed)   Palpitations    Noted more frequently on nights when working and taking Vyvanse. No associated symptoms such as dizziness, diaphoresis, shortness of breath, or chest pain. Likely related to Vyvanse use and possibly stress. -Continue current management with occasional use of Metoprolol as needed. -Report if palpitations worsen or occur outside of work/Vyvanse use.          Follow up plan: Return in about 1 year (around 10/20/2023) for CPE.  NEXT PREVENTATIVE PHYSICAL DUE IN 1 YEAR.  PATIENT COUNSELING PROVIDED FOR ALL ADULT PATIENTS: A well balanced diet low in saturated fats, cholesterol, and moderation in carbohydrates.  This can be as simple as monitoring portion sizes and cutting back on sugary beverages such as soda and juice to start with.    Daily water consumption of at least 64 ounces.  Physical activity at least 180 minutes per week.  If just starting out, start 10 minutes a day and work your way up.   This can be as simple as taking the stairs instead of the elevator and walking 2-3 laps around the office  purposefully every day.   STD protection, partner selection, and regular testing if high risk.  Limited consumption of alcoholic beverages if alcohol is consumed. For men, I recommend no more than 14 alcoholic beverages per week, spread out throughout the week (max 2 per day). Avoid "binge" drinking or consuming large quantities of alcohol in one setting.  Please let me know if you feel  you may need help with reduction or quitting alcohol consumption.    Avoidance of nicotine, if used. Please let me know if you feel you may need help with reduction or quitting nicotine use.   Daily mental health attention. This can be in the form of 5 minute daily meditation, prayer, journaling, yoga, reflection, etc.  Purposeful attention to your emotions and mental state can significantly improve your overall wellbeing  and  Health.  Please know that I am here to help you with all of your health care goals and am happy to work with you to find a solution that works best for you.  The greatest advice I have received with any changes in life are to take it one step at a time, that even means if all you can focus on is the next 60 seconds, then do that and celebrate your victories.  With any changes in life, you will have set backs, and that is OK. The important thing to remember is, if you have a set back, it is not a failure, it is an opportunity to try again! Screening Testing Mammogram Every 1 -2 years based on history and risk factors Starting at age 60 Pap Smear Ages 21-39 every 3 years Ages 50-65 every 5 years with HPV testing More frequent testing may be required based on results and history Colon Cancer Screening Every 1-10 years based on test performed, risk factors, and history Starting at age 46 Bone Density Screening Every 2-10 years based on history Starting at age 14 for women Recommendations for men differ based on medication usage, history, and risk factors AAA Screening One time ultrasound Men 35-85 years old who have every smoked Lung Cancer Screening Low Dose Lung CT every 12 months Age 28-80 years with a 30 pack-year smoking history who still smoke or who have quit within the last 15 years   Screening Labs Routine  Labs: Complete Blood Count (CBC), Complete Metabolic Panel (CMP), Cholesterol (Lipid Panel) Every 6-12 months based on history and medications May be recommended more frequently based on current conditions or  previous results Hemoglobin A1c Lab Every 3-12 months based on history and previous results Starting at age 43 or earlier with diagnosis of diabetes, high cholesterol, BMI >26, and/or risk factors Frequent monitoring for patients with diabetes to ensure blood sugar control Thyroid Panel (TSH) Every 6 months based on history, symptoms, and risk factors May be repeated more often if on medication HIV One time testing for all patients 41 and older May be repeated more frequently for patients with increased risk factors or exposure Hepatitis C One time testing for all patients 36 and older May be repeated more frequently for patients with increased risk factors or exposure Gonorrhea, Chlamydia Every 12 months for all sexually active persons 13-24 years Additional monitoring may be recommended for those who are considered high risk or who have symptoms Every 12 months for any woman on birth control, regardless of sexual activity PSA Men 60-59 years old with risk factors Additional screening may be recommended from age 83-69 based on risk factors, symptoms, and history  Vaccine Recommendations Tetanus Booster All adults every 10 years Flu Vaccine All patients 6 months and older every year COVID Vaccine All patients 12 years and older Initial dosing with booster May recommend additional booster based on age and health history HPV Vaccine 2 doses all patients age 3-26 Dosing may be considered for patients over 26 Shingles Vaccine (Shingrix) 2 doses all adults 55 years  and older Pneumonia (Pneumovax 23) All adults 65 years and older May recommend earlier dosing based on health history One year apart from Prevnar 13 Pneumonia (Prevnar 35) All adults 65 years and older Dosed 1 year after Pneumovax 23 Pneumonia (Prevnar 20) One time alternative to the two dosing of 13 and 23 For all adults with initial dose of 23, 20 is recommended 1 year later For all adults with initial dose of  13, 23 is still recommended as second option 1 year later

## 2022-10-21 ENCOUNTER — Other Ambulatory Visit (HOSPITAL_COMMUNITY): Payer: Self-pay

## 2022-10-21 LAB — CMP14+EGFR
ALT: 13 [IU]/L (ref 0–32)
AST: 14 [IU]/L (ref 0–40)
Albumin: 3.9 g/dL (ref 3.9–4.9)
Alkaline Phosphatase: 66 [IU]/L (ref 44–121)
BUN/Creatinine Ratio: 17 (ref 9–23)
BUN: 12 mg/dL (ref 6–24)
Bilirubin Total: 0.3 mg/dL (ref 0.0–1.2)
CO2: 19 mmol/L — ABNORMAL LOW (ref 20–29)
Calcium: 9.2 mg/dL (ref 8.7–10.2)
Chloride: 106 mmol/L (ref 96–106)
Creatinine, Ser: 0.69 mg/dL (ref 0.57–1.00)
Globulin, Total: 2.6 g/dL (ref 1.5–4.5)
Glucose: 117 mg/dL — ABNORMAL HIGH (ref 70–99)
Potassium: 4.1 mmol/L (ref 3.5–5.2)
Sodium: 141 mmol/L (ref 134–144)
Total Protein: 6.5 g/dL (ref 6.0–8.5)
eGFR: 112 mL/min/{1.73_m2} (ref >59)

## 2022-10-21 LAB — CBC WITH DIFFERENTIAL/PLATELET
Basophils Absolute: 0.1 10*3/uL (ref 0.0–0.2)
Basos: 1 %
EOS (ABSOLUTE): 0.5 10*3/uL — ABNORMAL HIGH (ref 0.0–0.4)
Eos: 6 %
Hematocrit: 41.6 % (ref 34.0–46.6)
Hemoglobin: 14 g/dL (ref 11.1–15.9)
Immature Grans (Abs): 0 10*3/uL (ref 0.0–0.1)
Immature Granulocytes: 0 %
Lymphocytes Absolute: 1.4 10*3/uL (ref 0.7–3.1)
Lymphs: 19 %
MCH: 31.7 pg (ref 26.6–33.0)
MCHC: 33.7 g/dL (ref 31.5–35.7)
MCV: 94 fL (ref 79–97)
Monocytes Absolute: 0.4 10*3/uL (ref 0.1–0.9)
Monocytes: 5 %
Neutrophils Absolute: 5.2 10*3/uL (ref 1.4–7.0)
Neutrophils: 69 %
Platelets: 316 10*3/uL (ref 150–450)
RBC: 4.42 x10E6/uL (ref 3.77–5.28)
RDW: 11.8 % (ref 11.7–15.4)
WBC: 7.6 10*3/uL (ref 3.4–10.8)

## 2022-10-21 LAB — HEPATITIS C ANTIBODY: Hep C Virus Ab: NONREACTIVE

## 2022-10-21 LAB — TSH: TSH: 1.66 u[IU]/mL (ref 0.450–4.500)

## 2022-10-21 LAB — LIPID PANEL
Chol/HDL Ratio: 2.3 {ratio} (ref 0.0–4.4)
Cholesterol, Total: 200 mg/dL — ABNORMAL HIGH (ref 100–199)
HDL: 87 mg/dL (ref >39)
LDL Chol Calc (NIH): 98 mg/dL (ref 0–99)
Triglycerides: 82 mg/dL (ref 0–149)
VLDL Cholesterol Cal: 15 mg/dL (ref 5–40)

## 2022-10-22 ENCOUNTER — Other Ambulatory Visit (HOSPITAL_COMMUNITY): Payer: Self-pay

## 2022-10-24 ENCOUNTER — Other Ambulatory Visit (HOSPITAL_COMMUNITY): Payer: Self-pay

## 2022-10-31 DIAGNOSIS — R002 Palpitations: Secondary | ICD-10-CM | POA: Insufficient documentation

## 2022-10-31 DIAGNOSIS — Z0000 Encounter for general adult medical examination without abnormal findings: Secondary | ICD-10-CM | POA: Insufficient documentation

## 2022-10-31 HISTORY — DX: Palpitations: R00.2

## 2022-10-31 NOTE — Assessment & Plan Note (Signed)

## 2022-10-31 NOTE — Assessment & Plan Note (Signed)
Vyvanse effective but not taken consistently due to variable work schedule. -Continue Vyvanse as currently prescribed, particularly on work nights.

## 2022-10-31 NOTE — Assessment & Plan Note (Signed)
Noted more frequently on nights when working and taking Vyvanse. No associated symptoms such as dizziness, diaphoresis, shortness of breath, or chest pain. Likely related to Vyvanse use and possibly stress. -Continue current management with occasional use of Metoprolol as needed. -Report if palpitations worsen or occur outside of work/Vyvanse use.

## 2022-10-31 NOTE — Assessment & Plan Note (Signed)
Chronic. Well controlled. No changes.

## 2022-10-31 NOTE — Assessment & Plan Note (Signed)
Chronic. Stable. No alarm symptoms. Continue wellbutrin.

## 2022-11-02 ENCOUNTER — Other Ambulatory Visit (HOSPITAL_COMMUNITY): Payer: Self-pay

## 2022-11-07 ENCOUNTER — Other Ambulatory Visit (HOSPITAL_COMMUNITY): Payer: Self-pay

## 2022-11-07 ENCOUNTER — Other Ambulatory Visit (HOSPITAL_BASED_OUTPATIENT_CLINIC_OR_DEPARTMENT_OTHER): Payer: Self-pay | Admitting: Nurse Practitioner

## 2022-11-07 ENCOUNTER — Encounter: Payer: Self-pay | Admitting: Nurse Practitioner

## 2022-11-07 DIAGNOSIS — L700 Acne vulgaris: Secondary | ICD-10-CM

## 2022-11-07 DIAGNOSIS — R110 Nausea: Secondary | ICD-10-CM

## 2022-11-07 DIAGNOSIS — G43909 Migraine, unspecified, not intractable, without status migrainosus: Secondary | ICD-10-CM

## 2022-11-07 MED ORDER — SPIRONOLACTONE 50 MG PO TABS: ORAL_TABLET | ORAL | 1 refills | Status: DC

## 2022-11-07 MED ORDER — ONDANSETRON 4 MG PO TBDP
4.0000 mg | ORAL_TABLET | Freq: Three times a day (TID) | ORAL | 0 refills | Status: DC | PRN
  Filled 2022-11-07: qty 30, 10d supply, fill #0

## 2022-11-08 ENCOUNTER — Other Ambulatory Visit: Payer: Self-pay

## 2022-11-08 ENCOUNTER — Other Ambulatory Visit (HOSPITAL_COMMUNITY): Payer: Self-pay

## 2022-11-10 ENCOUNTER — Other Ambulatory Visit (HOSPITAL_COMMUNITY): Payer: Self-pay

## 2022-11-14 ENCOUNTER — Other Ambulatory Visit (HOSPITAL_COMMUNITY): Payer: Self-pay

## 2022-11-14 ENCOUNTER — Other Ambulatory Visit (HOSPITAL_BASED_OUTPATIENT_CLINIC_OR_DEPARTMENT_OTHER): Payer: Self-pay | Admitting: Nurse Practitioner

## 2022-11-15 DIAGNOSIS — S92515A Nondisplaced fracture of proximal phalanx of left lesser toe(s), initial encounter for closed fracture: Secondary | ICD-10-CM | POA: Diagnosis not present

## 2022-11-23 ENCOUNTER — Other Ambulatory Visit (HOSPITAL_COMMUNITY): Payer: Self-pay

## 2022-11-28 ENCOUNTER — Other Ambulatory Visit (HOSPITAL_COMMUNITY): Payer: Self-pay

## 2022-11-28 ENCOUNTER — Other Ambulatory Visit (HOSPITAL_BASED_OUTPATIENT_CLINIC_OR_DEPARTMENT_OTHER): Payer: Self-pay | Admitting: Nurse Practitioner

## 2022-11-30 ENCOUNTER — Other Ambulatory Visit: Payer: Self-pay

## 2022-11-30 ENCOUNTER — Other Ambulatory Visit (HOSPITAL_COMMUNITY): Payer: Self-pay

## 2022-11-30 ENCOUNTER — Other Ambulatory Visit (HOSPITAL_BASED_OUTPATIENT_CLINIC_OR_DEPARTMENT_OTHER): Payer: Self-pay | Admitting: Nurse Practitioner

## 2022-11-30 DIAGNOSIS — L700 Acne vulgaris: Secondary | ICD-10-CM

## 2022-11-30 MED ORDER — SPIRONOLACTONE 50 MG PO TABS: ORAL_TABLET | ORAL | 1 refills | Status: DC

## 2022-11-30 MED ORDER — SPIRONOLACTONE 25 MG PO TABS
25.0000 mg | ORAL_TABLET | Freq: Two times a day (BID) | ORAL | 3 refills | Status: DC
  Filled 2022-11-30: qty 60, 30d supply, fill #0
  Filled 2023-03-20: qty 60, 30d supply, fill #1
  Filled 2023-05-22: qty 60, 30d supply, fill #2
  Filled 2023-07-10: qty 60, 30d supply, fill #3

## 2022-12-03 ENCOUNTER — Other Ambulatory Visit (HOSPITAL_COMMUNITY): Payer: Self-pay

## 2022-12-05 ENCOUNTER — Other Ambulatory Visit (HOSPITAL_COMMUNITY): Payer: Self-pay

## 2022-12-06 ENCOUNTER — Other Ambulatory Visit (HOSPITAL_COMMUNITY): Payer: Self-pay

## 2022-12-12 ENCOUNTER — Other Ambulatory Visit (HOSPITAL_COMMUNITY): Payer: Self-pay

## 2022-12-12 ENCOUNTER — Other Ambulatory Visit: Payer: Self-pay

## 2022-12-17 ENCOUNTER — Other Ambulatory Visit (HOSPITAL_COMMUNITY): Payer: Self-pay

## 2022-12-27 ENCOUNTER — Telehealth: Payer: Commercial Managed Care - PPO

## 2022-12-27 ENCOUNTER — Telehealth: Payer: Commercial Managed Care - PPO | Admitting: Family Medicine

## 2022-12-27 DIAGNOSIS — N3000 Acute cystitis without hematuria: Secondary | ICD-10-CM

## 2022-12-27 MED ORDER — CEPHALEXIN 500 MG PO CAPS: 500.0000 mg | ORAL_CAPSULE | Freq: Two times a day (BID) | ORAL | 0 refills | Status: AC

## 2022-12-27 NOTE — Progress Notes (Signed)
E-Visit for Urinary Problems  We are sorry that you are not feeling well.  Here is how we plan to help!  Based on what you shared with me it looks like you most likely have a simple urinary tract infection.  A UTI (Urinary Tract Infection) is a bacterial infection of the bladder.  Most cases of urinary tract infections are simple to treat but a key part of your care is to encourage you to drink plenty of fluids and watch your symptoms carefully.  I have prescribed Keflex 500 mg twice a day for 7 days.  Your symptoms should gradually improve. Call us if the burning in your urine worsens, you develop worsening fever, back pain or pelvic pain or if your symptoms do not resolve after completing the antibiotic.  Urinary tract infections can be prevented by drinking plenty of water to keep your body hydrated.  Also be sure when you wipe, wipe from front to back and don't hold it in!  If possible, empty your bladder every 4 hours.  HOME CARE Drink plenty of fluids Compete the full course of the antibiotics even if the symptoms resolve Remember, when you need to go.go. Holding in your urine can increase the likelihood of getting a UTI! GET HELP RIGHT AWAY IF: You cannot urinate You get a high fever Worsening back pain occurs You see blood in your urine You feel sick to your stomach or throw up You feel like you are going to pass out  MAKE SURE YOU  Understand these instructions. Will watch your condition. Will get help right away if you are not doing well or get worse.   Thank you for choosing an e-visit.  Your e-visit answers were reviewed by a board certified advanced clinical practitioner to complete your personal care plan. Depending upon the condition, your plan could have included both over the counter or prescription medications.  Please review your pharmacy choice. Make sure the pharmacy is open so you can pick up prescription now. If there is a problem, you may contact your  provider through MyChart messaging and have the prescription routed to another pharmacy.  Your safety is important to us. If you have drug allergies check your prescription carefully.   For the next 24 hours you can use MyChart to ask questions about today's visit, request a non-urgent call back, or ask for a work or school excuse. You will get an email in the next two days asking about your experience. I hope that your e-visit has been valuable and will speed your recovery.    have provided 5 minutes of non face to face time during this encounter for chart review and documentation.   

## 2023-01-05 ENCOUNTER — Other Ambulatory Visit (HOSPITAL_COMMUNITY): Payer: Self-pay

## 2023-01-06 ENCOUNTER — Other Ambulatory Visit (HOSPITAL_COMMUNITY): Payer: Self-pay

## 2023-01-07 ENCOUNTER — Other Ambulatory Visit (HOSPITAL_COMMUNITY): Payer: Self-pay

## 2023-01-17 ENCOUNTER — Other Ambulatory Visit: Payer: Self-pay

## 2023-01-17 ENCOUNTER — Other Ambulatory Visit (HOSPITAL_COMMUNITY): Payer: Self-pay

## 2023-01-17 ENCOUNTER — Other Ambulatory Visit (HOSPITAL_BASED_OUTPATIENT_CLINIC_OR_DEPARTMENT_OTHER): Payer: Self-pay | Admitting: Nurse Practitioner

## 2023-01-17 DIAGNOSIS — G43909 Migraine, unspecified, not intractable, without status migrainosus: Secondary | ICD-10-CM

## 2023-01-17 DIAGNOSIS — R110 Nausea: Secondary | ICD-10-CM

## 2023-01-17 MED ORDER — ONDANSETRON 4 MG PO TBDP
4.0000 mg | ORAL_TABLET | Freq: Three times a day (TID) | ORAL | 1 refills | Status: DC | PRN
  Filled 2023-01-17: qty 30, 10d supply, fill #0
  Filled 2023-05-22: qty 30, 10d supply, fill #1

## 2023-01-18 ENCOUNTER — Other Ambulatory Visit: Payer: Self-pay

## 2023-03-01 ENCOUNTER — Other Ambulatory Visit: Payer: Self-pay | Admitting: Nurse Practitioner

## 2023-03-01 ENCOUNTER — Other Ambulatory Visit (HOSPITAL_COMMUNITY): Payer: Self-pay

## 2023-03-01 DIAGNOSIS — Z0000 Encounter for general adult medical examination without abnormal findings: Secondary | ICD-10-CM

## 2023-03-01 DIAGNOSIS — F900 Attention-deficit hyperactivity disorder, predominantly inattentive type: Secondary | ICD-10-CM

## 2023-03-02 ENCOUNTER — Other Ambulatory Visit (HOSPITAL_COMMUNITY): Payer: Self-pay

## 2023-03-02 MED ORDER — LISDEXAMFETAMINE DIMESYLATE 40 MG PO CAPS
40.0000 mg | ORAL_CAPSULE | ORAL | 0 refills | Status: DC
  Filled 2023-03-02: qty 30, 30d supply, fill #0

## 2023-03-03 ENCOUNTER — Other Ambulatory Visit: Payer: Self-pay

## 2023-03-11 IMAGING — MG MM DIGITAL SCREENING BILAT W/ TOMO AND CAD
8 series · 9 of 24 positions shown · non-contrast
Comparison: None.

CLINICAL DATA: Screening.

EXAM:
DIGITAL SCREENING BILATERAL MAMMOGRAM WITH TOMOSYNTHESIS AND CAD
TECHNIQUE: Bilateral screening digital craniocaudal and mediolateral oblique
mammograms were obtained. Bilateral screening digital breast
tomosynthesis was performed. The images were evaluated with
computer-aided detection.

[R MLO synth-2D]
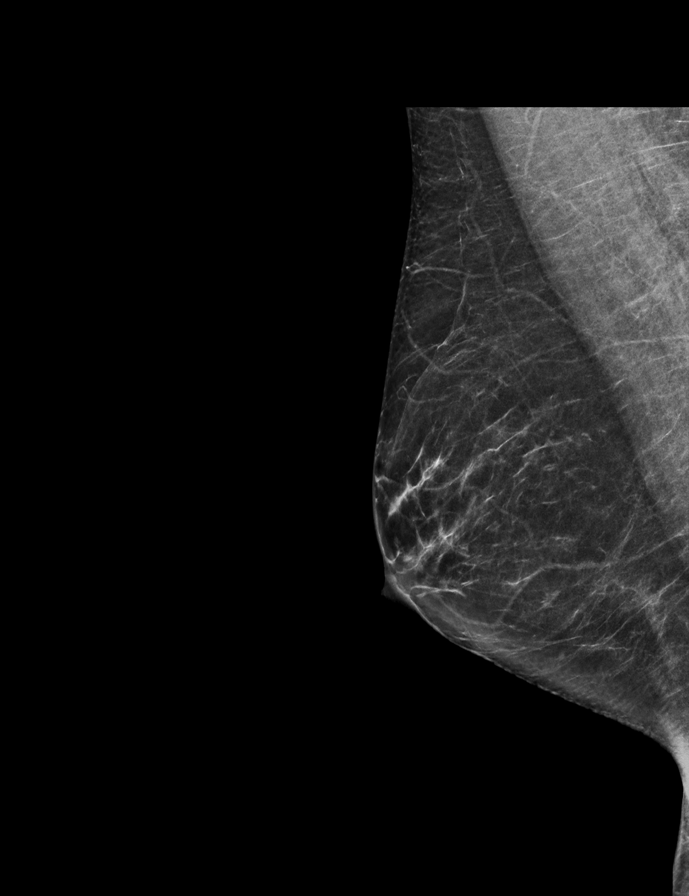

[R CC synth-2D]
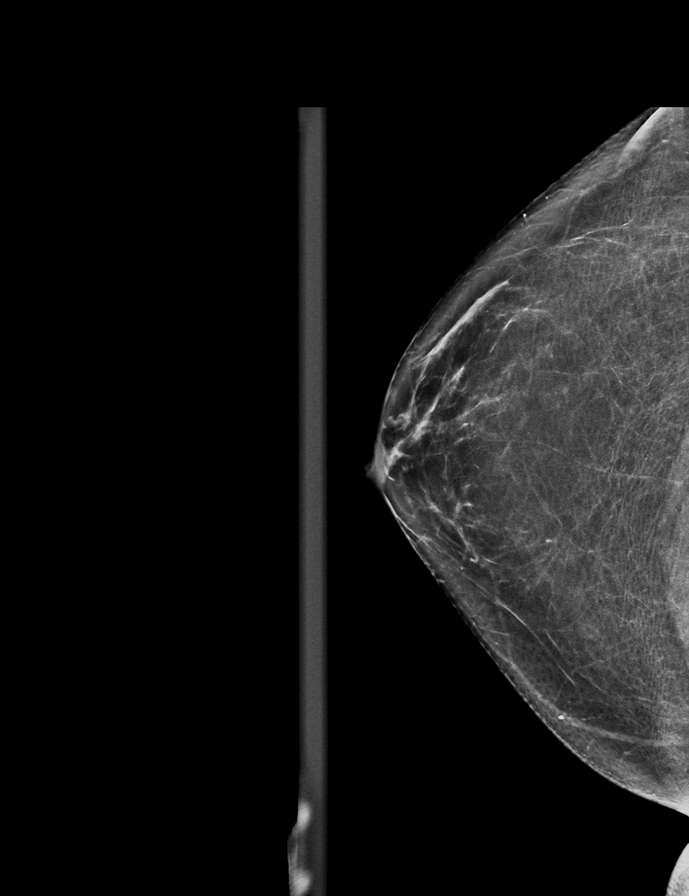

[L MLO synth-2D]
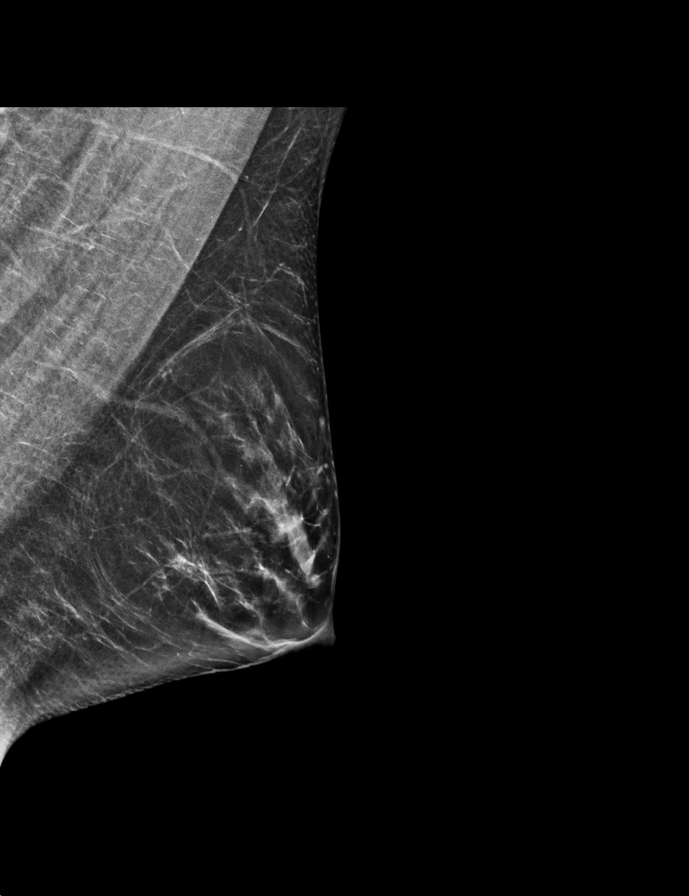

[L CC synth-2D]
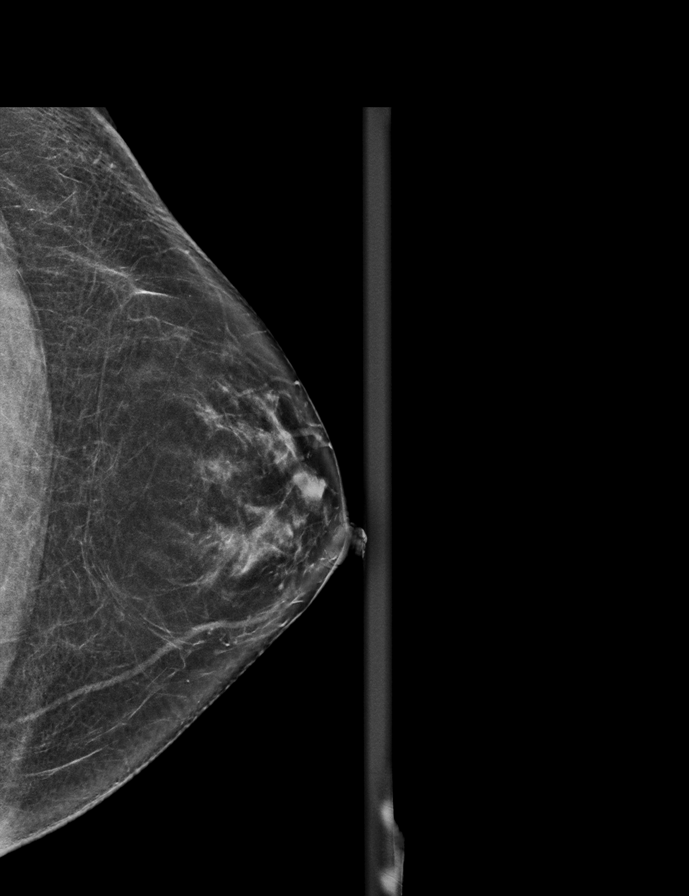

[R CC tomo · 2 of 62 frames shown]
[frame 21/62]
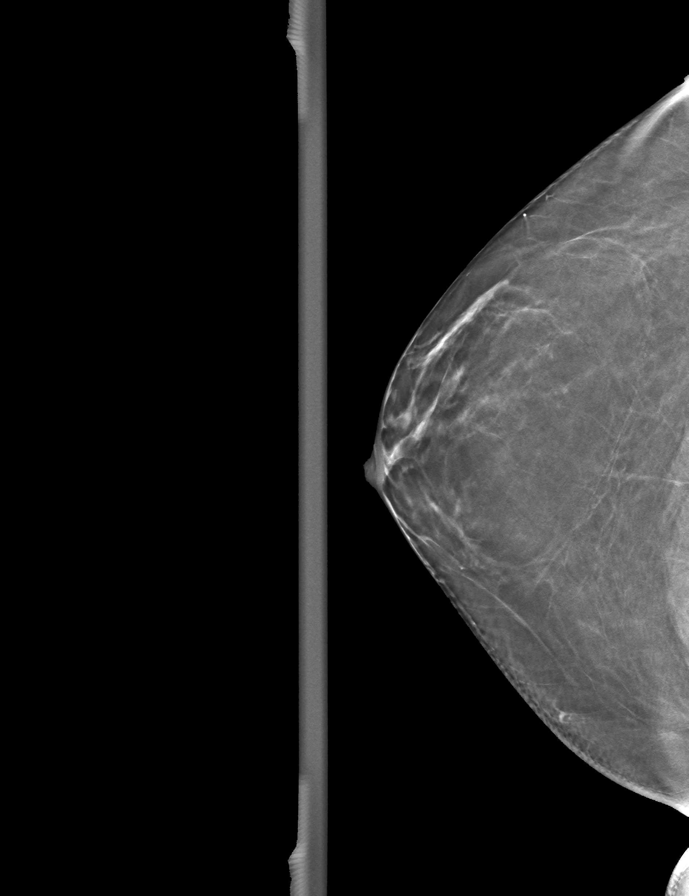
[frame 31/62]
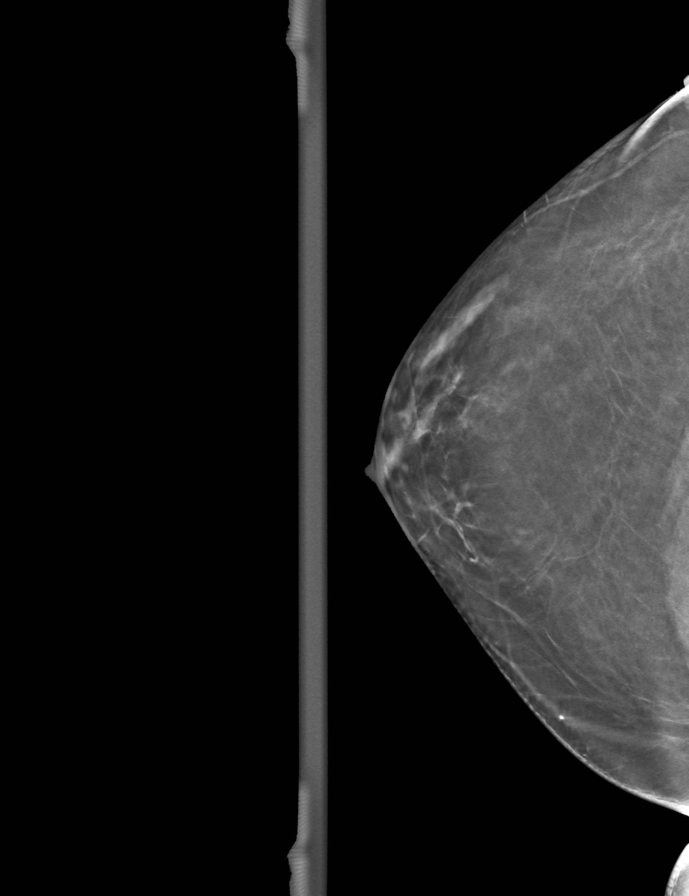

[L MLO tomo · tomo slice 34/67.0]
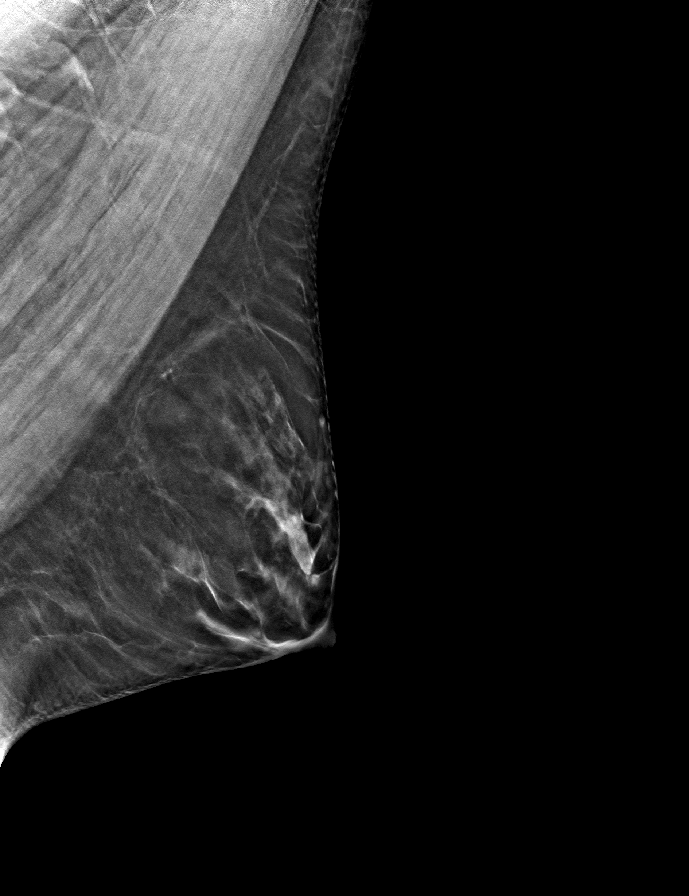

[R MLO tomo · tomo slice 31/61.0]
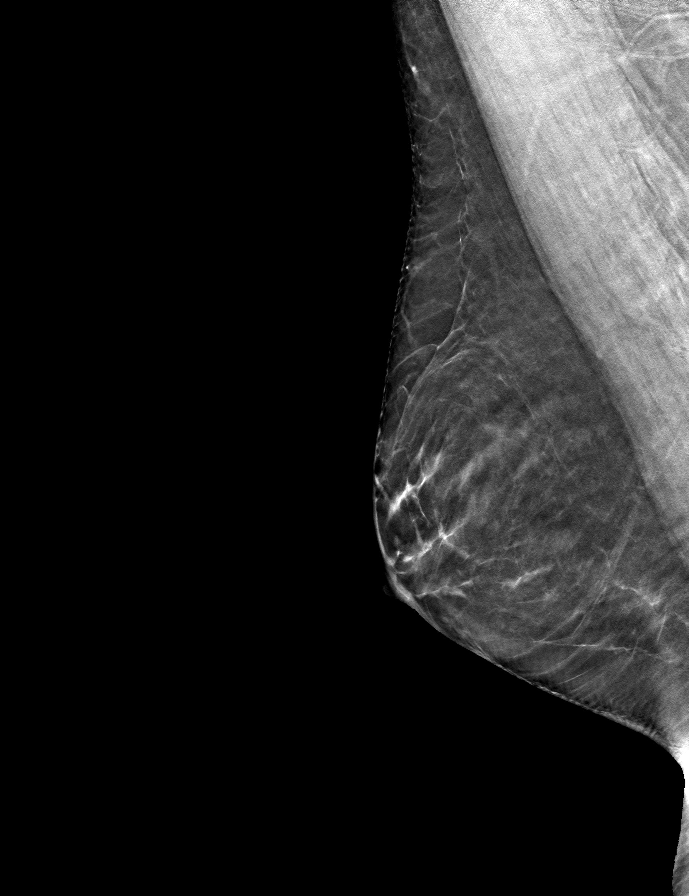

[L CC tomo · tomo slice 33/64.0]
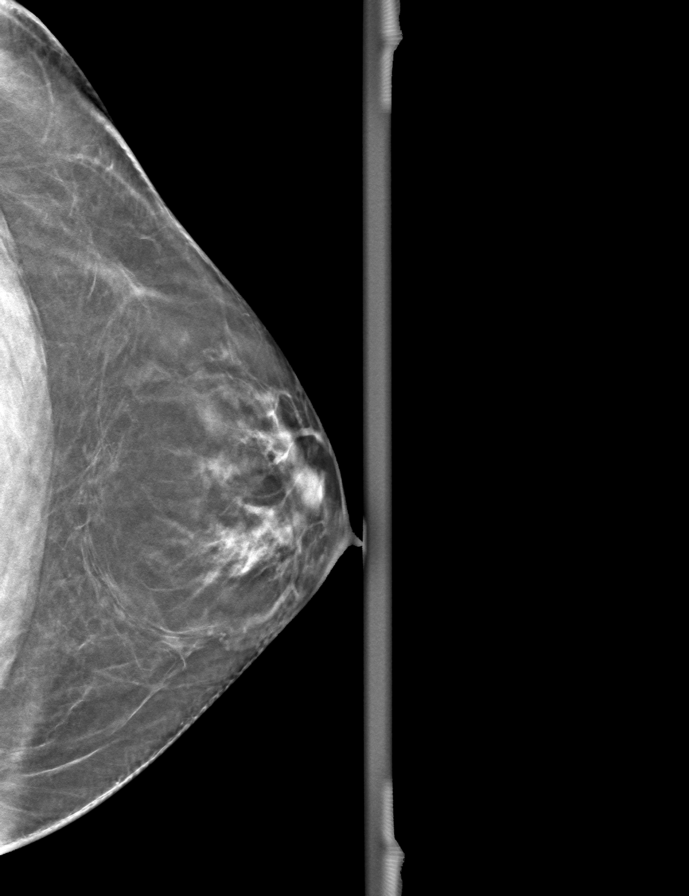

[9 of 24 positions shown; findings below may reference images not displayed]

ACR Breast Density Category b: There are scattered areas of
fibroglandular density.
FINDINGS: In the left breast, a possible asymmetry warrants further
evaluation. In the right breast, no findings suspicious for
malignancy.
IMPRESSION: Further evaluation is suggested for possible asymmetry in the left
breast.

RECOMMENDATION:
Diagnostic mammogram and possibly ultrasound of the left breast.
(Code:[id number])

The patient will be contacted regarding the findings, and additional
imaging will be scheduled.

BI-RADS CATEGORY  0: Incomplete. Need additional imaging evaluation
and/or prior mammograms for comparison.

## 2023-03-20 ENCOUNTER — Encounter (HOSPITAL_COMMUNITY): Payer: Self-pay | Admitting: Pharmacist

## 2023-03-20 ENCOUNTER — Other Ambulatory Visit (HOSPITAL_COMMUNITY): Payer: Self-pay

## 2023-03-20 ENCOUNTER — Other Ambulatory Visit: Payer: Self-pay | Admitting: Nurse Practitioner

## 2023-03-20 DIAGNOSIS — F900 Attention-deficit hyperactivity disorder, predominantly inattentive type: Secondary | ICD-10-CM

## 2023-03-20 DIAGNOSIS — F418 Other specified anxiety disorders: Secondary | ICD-10-CM

## 2023-03-20 DIAGNOSIS — Z0000 Encounter for general adult medical examination without abnormal findings: Secondary | ICD-10-CM

## 2023-03-20 NOTE — Telephone Encounter (Signed)
Last apt. 10/20/22.

## 2023-03-21 ENCOUNTER — Other Ambulatory Visit: Payer: Self-pay

## 2023-03-21 ENCOUNTER — Other Ambulatory Visit (HOSPITAL_COMMUNITY): Payer: Self-pay

## 2023-03-21 MED ORDER — BUPROPION HCL ER (XL) 150 MG PO TB24
150.0000 mg | ORAL_TABLET | Freq: Every day | ORAL | 1 refills | Status: DC
  Filled 2023-03-21: qty 90, 90d supply, fill #0
  Filled 2023-07-10: qty 90, 90d supply, fill #1

## 2023-03-21 MED ORDER — LISDEXAMFETAMINE DIMESYLATE 40 MG PO CAPS
40.0000 mg | ORAL_CAPSULE | ORAL | 0 refills | Status: DC
Start: 2023-03-21 — End: 2023-05-22
  Filled 2023-03-21 – 2023-04-11 (×3): qty 30, 30d supply, fill #0

## 2023-03-22 ENCOUNTER — Other Ambulatory Visit (HOSPITAL_COMMUNITY): Payer: Self-pay

## 2023-04-11 ENCOUNTER — Other Ambulatory Visit: Payer: Self-pay

## 2023-04-11 ENCOUNTER — Other Ambulatory Visit (HOSPITAL_COMMUNITY): Payer: Self-pay

## 2023-05-22 ENCOUNTER — Other Ambulatory Visit (HOSPITAL_COMMUNITY): Payer: Self-pay

## 2023-05-22 ENCOUNTER — Other Ambulatory Visit: Payer: Self-pay | Admitting: Nurse Practitioner

## 2023-05-22 DIAGNOSIS — F900 Attention-deficit hyperactivity disorder, predominantly inattentive type: Secondary | ICD-10-CM

## 2023-05-22 DIAGNOSIS — Z0000 Encounter for general adult medical examination without abnormal findings: Secondary | ICD-10-CM

## 2023-05-22 MED ORDER — LISDEXAMFETAMINE DIMESYLATE 40 MG PO CAPS
40.0000 mg | ORAL_CAPSULE | ORAL | 0 refills | Status: DC
  Filled 2023-05-22: qty 30, 30d supply, fill #0

## 2023-05-22 NOTE — Telephone Encounter (Signed)
Last apt. 10/20/22.

## 2023-05-23 ENCOUNTER — Other Ambulatory Visit (HOSPITAL_COMMUNITY): Payer: Self-pay

## 2023-06-29 ENCOUNTER — Other Ambulatory Visit: Payer: Self-pay | Admitting: Obstetrics and Gynecology

## 2023-06-29 DIAGNOSIS — Z1231 Encounter for screening mammogram for malignant neoplasm of breast: Secondary | ICD-10-CM

## 2023-07-05 ENCOUNTER — Ambulatory Visit
Admission: RE | Admit: 2023-07-05 | Discharge: 2023-07-05 | Disposition: A | Discharge status: Home / Self Care | Source: Ambulatory Visit | Attending: Obstetrics and Gynecology | Admitting: Obstetrics and Gynecology

## 2023-07-05 DIAGNOSIS — Z1231 Encounter for screening mammogram for malignant neoplasm of breast: Secondary | ICD-10-CM | POA: Diagnosis not present

## 2023-07-10 ENCOUNTER — Other Ambulatory Visit (HOSPITAL_COMMUNITY): Payer: Self-pay

## 2023-07-18 ENCOUNTER — Other Ambulatory Visit (HOSPITAL_COMMUNITY): Payer: Self-pay

## 2023-07-24 ENCOUNTER — Other Ambulatory Visit (HOSPITAL_COMMUNITY): Payer: Self-pay

## 2023-07-24 ENCOUNTER — Other Ambulatory Visit: Payer: Self-pay | Admitting: Nurse Practitioner

## 2023-07-24 DIAGNOSIS — Z0000 Encounter for general adult medical examination without abnormal findings: Secondary | ICD-10-CM

## 2023-07-24 DIAGNOSIS — F900 Attention-deficit hyperactivity disorder, predominantly inattentive type: Secondary | ICD-10-CM

## 2023-07-24 MED ORDER — LISDEXAMFETAMINE DIMESYLATE 40 MG PO CAPS
40.0000 mg | ORAL_CAPSULE | ORAL | 0 refills | Status: DC
  Filled 2023-07-24: qty 30, 30d supply, fill #0

## 2023-07-24 NOTE — Telephone Encounter (Signed)
Last apt. 10/20/22.

## 2023-08-01 NOTE — Telephone Encounter (Signed)
 Patient called in to schedule her annual wellness exam. Patient is scheduled for the next available physical with PCP for 12/29/2023. Patient placed on the wait list for a sooner appointment after 10/20/2023 preferable.

## 2023-08-11 ENCOUNTER — Other Ambulatory Visit (HOSPITAL_BASED_OUTPATIENT_CLINIC_OR_DEPARTMENT_OTHER): Payer: Self-pay | Admitting: Nurse Practitioner

## 2023-08-11 ENCOUNTER — Other Ambulatory Visit (HOSPITAL_COMMUNITY): Payer: Self-pay

## 2023-08-14 ENCOUNTER — Other Ambulatory Visit: Payer: Self-pay

## 2023-08-14 ENCOUNTER — Other Ambulatory Visit (HOSPITAL_COMMUNITY): Payer: Self-pay

## 2023-08-14 MED ORDER — SPIRONOLACTONE 25 MG PO TABS
25.0000 mg | ORAL_TABLET | Freq: Two times a day (BID) | ORAL | 3 refills | Status: DC
  Filled 2023-08-14: qty 60, 30d supply, fill #0
  Filled 2023-09-08: qty 60, 30d supply, fill #1
  Filled 2023-10-16 – 2023-10-17 (×3): qty 60, 30d supply, fill #2
  Filled 2023-12-24: qty 60, 30d supply, fill #3

## 2023-08-31 DIAGNOSIS — B0089 Other herpesviral infection: Secondary | ICD-10-CM | POA: Diagnosis not present

## 2023-09-08 ENCOUNTER — Other Ambulatory Visit: Payer: Self-pay

## 2023-09-08 ENCOUNTER — Other Ambulatory Visit (HOSPITAL_BASED_OUTPATIENT_CLINIC_OR_DEPARTMENT_OTHER): Payer: Self-pay | Admitting: Nurse Practitioner

## 2023-09-08 ENCOUNTER — Other Ambulatory Visit: Payer: Self-pay | Admitting: Nurse Practitioner

## 2023-09-08 DIAGNOSIS — F418 Other specified anxiety disorders: Secondary | ICD-10-CM

## 2023-09-08 DIAGNOSIS — F900 Attention-deficit hyperactivity disorder, predominantly inattentive type: Secondary | ICD-10-CM

## 2023-09-08 DIAGNOSIS — G43909 Migraine, unspecified, not intractable, without status migrainosus: Secondary | ICD-10-CM

## 2023-09-08 DIAGNOSIS — Z0000 Encounter for general adult medical examination without abnormal findings: Secondary | ICD-10-CM

## 2023-09-08 DIAGNOSIS — R110 Nausea: Secondary | ICD-10-CM

## 2023-09-11 ENCOUNTER — Other Ambulatory Visit: Payer: Self-pay

## 2023-09-11 ENCOUNTER — Other Ambulatory Visit (HOSPITAL_COMMUNITY): Payer: Self-pay

## 2023-09-11 MED ORDER — LISDEXAMFETAMINE DIMESYLATE 40 MG PO CAPS
40.0000 mg | ORAL_CAPSULE | ORAL | 0 refills | Status: DC
  Filled 2023-10-16 – 2023-10-17 (×3): qty 30, 30d supply, fill #0

## 2023-09-11 MED ORDER — BUPROPION HCL ER (XL) 150 MG PO TB24
150.0000 mg | ORAL_TABLET | Freq: Every day | ORAL | 1 refills | Status: DC
  Filled 2023-09-11 – 2023-10-17 (×4): qty 90, 90d supply, fill #0

## 2023-09-11 MED ORDER — ONDANSETRON 4 MG PO TBDP
4.0000 mg | ORAL_TABLET | Freq: Three times a day (TID) | ORAL | 1 refills | Status: AC | PRN
  Filled 2023-09-11: qty 30, 10d supply, fill #0
  Filled 2023-12-24 – 2024-01-03 (×3): qty 30, 10d supply, fill #1

## 2023-09-11 MED ORDER — LISDEXAMFETAMINE DIMESYLATE 40 MG PO CAPS
40.0000 mg | ORAL_CAPSULE | ORAL | 0 refills | Status: DC
  Filled 2023-11-11 – 2023-12-07 (×4): qty 30, 30d supply, fill #0

## 2023-09-11 MED ORDER — LISDEXAMFETAMINE DIMESYLATE 40 MG PO CAPS
40.0000 mg | ORAL_CAPSULE | ORAL | 0 refills | Status: DC
  Filled 2023-09-11: qty 30, 30d supply, fill #0

## 2023-09-12 ENCOUNTER — Other Ambulatory Visit: Payer: Self-pay

## 2023-09-12 ENCOUNTER — Other Ambulatory Visit (HOSPITAL_COMMUNITY): Payer: Self-pay

## 2023-10-13 ENCOUNTER — Other Ambulatory Visit: Payer: Self-pay | Admitting: *Deleted

## 2023-10-13 DIAGNOSIS — Z006 Encounter for examination for normal comparison and control in clinical research program: Secondary | ICD-10-CM

## 2023-10-16 ENCOUNTER — Other Ambulatory Visit: Payer: Self-pay

## 2023-10-16 ENCOUNTER — Other Ambulatory Visit (HOSPITAL_COMMUNITY): Payer: Self-pay

## 2023-10-16 MED ORDER — ETONOGESTREL-ETHINYL ESTRADIOL 0.12-0.015 MG/24HR VA RING
VAGINAL_RING | VAGINAL | 4 refills | Status: AC
  Filled 2023-10-16: qty 12, 84d supply, fill #0
  Filled 2023-10-16 – 2023-10-17 (×2): qty 3, 84d supply, fill #0
  Filled 2024-01-05 (×3): qty 3, 84d supply, fill #1

## 2023-10-17 ENCOUNTER — Other Ambulatory Visit (HOSPITAL_COMMUNITY): Payer: Self-pay

## 2023-10-17 DIAGNOSIS — Z01419 Encounter for gynecological examination (general) (routine) without abnormal findings: Secondary | ICD-10-CM | POA: Diagnosis not present

## 2023-10-17 DIAGNOSIS — Z3040 Encounter for surveillance of contraceptives, unspecified: Secondary | ICD-10-CM | POA: Diagnosis not present

## 2023-10-17 DIAGNOSIS — Z1389 Encounter for screening for other disorder: Secondary | ICD-10-CM | POA: Diagnosis not present

## 2023-10-17 MED ORDER — ETONOGESTREL-ETHINYL ESTRADIOL 0.12-0.015 MG/24HR VA RING
VAGINAL_RING | VAGINAL | 4 refills | Status: AC
  Filled 2023-10-17: qty 4, 96d supply, fill #0
  Filled 2024-02-07: qty 4, 112d supply, fill #0

## 2023-10-18 ENCOUNTER — Other Ambulatory Visit (HOSPITAL_COMMUNITY): Payer: Self-pay

## 2023-11-07 ENCOUNTER — Other Ambulatory Visit: Payer: Self-pay | Admitting: Nurse Practitioner

## 2023-11-07 DIAGNOSIS — Z0000 Encounter for general adult medical examination without abnormal findings: Secondary | ICD-10-CM

## 2023-11-07 NOTE — Telephone Encounter (Signed)
 Is this okay to refill?

## 2023-11-11 ENCOUNTER — Other Ambulatory Visit (HOSPITAL_COMMUNITY): Payer: Self-pay

## 2023-11-20 ENCOUNTER — Other Ambulatory Visit: Payer: Self-pay | Admitting: Nurse Practitioner

## 2023-11-20 DIAGNOSIS — Z0000 Encounter for general adult medical examination without abnormal findings: Secondary | ICD-10-CM

## 2023-11-20 DIAGNOSIS — F418 Other specified anxiety disorders: Secondary | ICD-10-CM

## 2023-11-21 ENCOUNTER — Other Ambulatory Visit: Payer: Self-pay

## 2023-11-21 ENCOUNTER — Other Ambulatory Visit (HOSPITAL_COMMUNITY): Payer: Self-pay

## 2023-11-21 ENCOUNTER — Encounter: Payer: Self-pay | Admitting: Pharmacist

## 2023-11-21 MED ORDER — SERTRALINE HCL 50 MG PO TABS
50.0000 mg | ORAL_TABLET | Freq: Every day | ORAL | 0 refills | Status: DC
  Filled 2023-11-21 – 2023-11-23 (×2): qty 90, 90d supply, fill #0

## 2023-11-23 ENCOUNTER — Other Ambulatory Visit: Payer: Self-pay

## 2023-11-23 ENCOUNTER — Other Ambulatory Visit (HOSPITAL_COMMUNITY): Payer: Self-pay

## 2023-11-28 ENCOUNTER — Telehealth: Payer: Self-pay

## 2023-11-28 NOTE — Telephone Encounter (Signed)
 Left message for pt to call me back

## 2023-11-28 NOTE — Telephone Encounter (Signed)
 Copied from CRM [id number]. Topic: General - Other >> Nov 28, 2023  1:09 PM Ahlexyia S wrote: Reason for CRM: Pt is requesting to speak to practice manager due to scheduling. Pt initially had an appointment scheduled for 11/28/23 (this was cancelled due to the holiday) and then 12/29/23 (cancelled via mychart by pt). I informed pt with this information and pt stated she didn't cancel any appointments and isnt sure why this keeps happening. Pt stated she doesn't understand how provider has pretty much open availability for sick visits but not anything for what she needing to be seen for. Pt prefers to speak to someone in clinic. I empathized and apologized to pt letting her know that it wasn't much that I could do as far as openings. Pt is requesting a callback from engineer, manufacturing. Informed pt that someone will contact her and also advised of turnaround time for callbacks.

## 2023-11-28 NOTE — Telephone Encounter (Signed)
 Spoke to patient and rescheduled her physical

## 2023-11-30 ENCOUNTER — Encounter: Admitting: Nurse Practitioner

## 2023-12-07 ENCOUNTER — Other Ambulatory Visit (HOSPITAL_COMMUNITY): Payer: Self-pay

## 2023-12-07 ENCOUNTER — Other Ambulatory Visit (HOSPITAL_BASED_OUTPATIENT_CLINIC_OR_DEPARTMENT_OTHER): Payer: Self-pay

## 2023-12-24 ENCOUNTER — Other Ambulatory Visit (HOSPITAL_COMMUNITY): Payer: Self-pay

## 2023-12-25 ENCOUNTER — Other Ambulatory Visit (HOSPITAL_COMMUNITY): Payer: Self-pay

## 2023-12-29 ENCOUNTER — Encounter: Payer: Self-pay | Admitting: Nurse Practitioner

## 2024-01-01 ENCOUNTER — Other Ambulatory Visit (HOSPITAL_COMMUNITY): Payer: Self-pay

## 2024-01-01 ENCOUNTER — Ambulatory Visit: Admitting: Nurse Practitioner

## 2024-01-01 ENCOUNTER — Other Ambulatory Visit: Payer: Self-pay

## 2024-01-01 ENCOUNTER — Encounter: Payer: Self-pay | Admitting: Nurse Practitioner

## 2024-01-01 VITALS — BP 120/80 | HR 72 | Ht 67.25 in | Wt 155.6 lb

## 2024-01-01 DIAGNOSIS — Z0000 Encounter for general adult medical examination without abnormal findings: Secondary | ICD-10-CM | POA: Diagnosis not present

## 2024-01-01 DIAGNOSIS — Z13228 Encounter for screening for other metabolic disorders: Secondary | ICD-10-CM | POA: Diagnosis not present

## 2024-01-01 DIAGNOSIS — E559 Vitamin D deficiency, unspecified: Secondary | ICD-10-CM | POA: Diagnosis not present

## 2024-01-01 DIAGNOSIS — I10 Essential (primary) hypertension: Secondary | ICD-10-CM | POA: Diagnosis not present

## 2024-01-01 DIAGNOSIS — Z1321 Encounter for screening for nutritional disorder: Secondary | ICD-10-CM

## 2024-01-01 DIAGNOSIS — Z7185 Encounter for immunization safety counseling: Secondary | ICD-10-CM

## 2024-01-01 DIAGNOSIS — F418 Other specified anxiety disorders: Secondary | ICD-10-CM | POA: Diagnosis not present

## 2024-01-01 DIAGNOSIS — Z23 Encounter for immunization: Secondary | ICD-10-CM

## 2024-01-01 DIAGNOSIS — K5120 Ulcerative (chronic) proctitis without complications: Secondary | ICD-10-CM

## 2024-01-01 DIAGNOSIS — L700 Acne vulgaris: Secondary | ICD-10-CM

## 2024-01-01 DIAGNOSIS — F900 Attention-deficit hyperactivity disorder, predominantly inattentive type: Secondary | ICD-10-CM | POA: Diagnosis not present

## 2024-01-01 DIAGNOSIS — Z1329 Encounter for screening for other suspected endocrine disorder: Secondary | ICD-10-CM

## 2024-01-01 MED ORDER — LISDEXAMFETAMINE DIMESYLATE 40 MG PO CAPS
40.0000 mg | ORAL_CAPSULE | ORAL | 0 refills | Status: AC
  Filled 2024-01-01 – 2024-01-19 (×3): qty 30, 30d supply, fill #0

## 2024-01-01 MED ORDER — BUPROPION HCL ER (XL) 150 MG PO TB24
150.0000 mg | ORAL_TABLET | Freq: Every day | ORAL | 3 refills | Status: AC
  Filled 2024-01-01 – 2024-01-03 (×2): qty 90, 90d supply, fill #0

## 2024-01-01 MED ORDER — LISDEXAMFETAMINE DIMESYLATE 40 MG PO CAPS: 40.0000 mg | ORAL_CAPSULE | ORAL | 0 refills | Status: AC

## 2024-01-01 MED ORDER — SERTRALINE HCL 50 MG PO TABS
50.0000 mg | ORAL_TABLET | Freq: Every day | ORAL | 3 refills | Status: AC
  Filled 2024-01-01: qty 90, 90d supply, fill #0

## 2024-01-01 MED ORDER — AMPHETAMINE-DEXTROAMPHETAMINE 20 MG PO TABS: 10.0000 mg | ORAL_TABLET | Freq: Every day | ORAL | 0 refills | Status: AC

## 2024-01-01 MED ORDER — AMPHETAMINE-DEXTROAMPHETAMINE 20 MG PO TABS
10.0000 mg | ORAL_TABLET | Freq: Every day | ORAL | 0 refills | Status: AC
  Filled 2024-01-01 – 2024-01-03 (×2): qty 30, 30d supply, fill #0

## 2024-01-01 MED ORDER — LISDEXAMFETAMINE DIMESYLATE 40 MG PO CAPS
40.0000 mg | ORAL_CAPSULE | ORAL | 0 refills | Status: AC
  Filled 2024-02-07: qty 30, 30d supply, fill #0

## 2024-01-01 NOTE — Patient Instructions (Addendum)
 You look great today! Keep up the amazing work!   For all adult patients, I recommend A well balanced diet low in saturated fats, cholesterol, and moderation in carbohydrates.   This can be as simple as monitoring portion sizes and cutting back on sugary beverages such as soda and juice to start with.    Daily water consumption of at least 64 ounces.  Physical activity at least 180 minutes per week, if just starting out.   This can be as simple as taking the stairs instead of the elevator and walking 2-3 laps around the office  purposefully every day.   STD protection, partner selection, and regular testing if high risk.  Limited consumption of alcoholic beverages if alcohol is consumed.  For women, I recommend no more than 7 alcoholic beverages per week, spread out throughout the week.  Avoid binge drinking or consuming large quantities of alcohol in one setting.   Please let me know if you feel you may need help with reduction or quitting alcohol consumption.   Avoidance of nicotine, if used.  Please let me know if you feel you may need help with reduction or quitting nicotine use.   Daily mental health attention.  This can be in the form of 5 minute daily meditation, prayer, journaling, yoga, reflection, etc.   Purposeful attention to your emotions and mental state can significantly improve your overall wellbeing  and  Health.  Please know that I am here to help you with all of your health care goals and am happy to work with you to find a solution that works best for you.  The greatest advice I have received with any changes in life are to take it one step at a time, that even means if all you can focus on is the next 60 seconds, then do that and celebrate your victories.  With any changes in life, you will have set backs, and that is OK. The important thing to remember is, if you have a set back, it is not a failure, it is an opportunity to try again!  Health Maintenance  Recommendations Screening Testing Mammogram Every 1 -2 years based on history and risk factors Starting at age 42 Pap Smear Ages 21-39 every 3 years Ages 50-65 every 5 years with HPV testing More frequent testing may be required based on results and history Colon Cancer Screening Every 1-10 years based on test performed, risk factors, and history Starting at age 42 Bone Density Screening Every 2-10 years based on history Starting at age 89 for women Recommendations for men differ based on medication usage, history, and risk factors AAA Screening One time ultrasound Men 57-68 years old who have every smoked Lung Cancer Screening Low Dose Lung CT every 12 months Age 82-80 years with a 30 pack-year smoking history who still smoke or who have quit within the last 15 years  Screening Labs Routine  Labs: Complete Blood Count (CBC), Complete Metabolic Panel (CMP), Cholesterol (Lipid Panel) Every 6-12 months based on history and medications May be recommended more frequently based on current conditions or previous results Hemoglobin A1c Lab Every 3-12 months based on history and previous results Starting at age 64 or earlier with diagnosis of diabetes, high cholesterol, BMI >26, and/or risk factors Frequent monitoring for patients with diabetes to ensure blood sugar control Thyroid  Panel (TSH w/ T3 & T4) Every 6 months based on history, symptoms, and risk factors May be repeated more often if on medication HIV One  time testing for all patients 13 and older May be repeated more frequently for patients with increased risk factors or exposure Hepatitis C One time testing for all patients 98 and older May be repeated more frequently for patients with increased risk factors or exposure Gonorrhea, Chlamydia Every 12 months for all sexually active persons 13-24 years Additional monitoring may be recommended for those who are considered high risk or who have symptoms PSA Men 6-54 years  old with risk factors Additional screening may be recommended from age 63-69 based on risk factors, symptoms, and history  Vaccine Recommendations Tetanus Booster All adults every 10 years Flu Vaccine All patients 6 months and older every year COVID Vaccine All patients 12 years and older Initial dosing with booster May recommend additional booster based on age and health history HPV Vaccine 2 doses all patients age 31-26 Dosing may be considered for patients over 26 Shingles Vaccine (Shingrix) 2 doses all adults 55 years and older Pneumonia (Pneumovax 23) All adults 65 years and older May recommend earlier dosing based on health history Pneumonia (Prevnar 20) All adults 65 years and older Dosed 1 year after Pneumovax 23  Additional Screening, Testing, and Vaccinations may be recommended on an individualized basis based on family history, health history, risk factors, and/or exposure.

## 2024-01-01 NOTE — Assessment & Plan Note (Addendum)
°  Orders:   CBC with Differential/Platelet   CMP14+EGFR   Hemoglobin A1c   Lipid panel

## 2024-01-01 NOTE — Progress Notes (Unsigned)
 Covid: declines Flu: HPV:  {SEHM (Optional):34217}  Catheline Doing, DNP, AGNP-c [facility] [address] [place], [place] [address] Main Office [phone] VISIT TYPE: CPE on 01/01/2024 Today's Vitals   01/01/24 1331  BP: 120/80  Pulse: 72  Weight: 155 lb 9.6 oz (70.6 kg)  Height: 5' 7.25 (1.708 m)   Body mass index is 24.19 kg/m.  Wt Readings from Last 3 Encounters:  01/01/24 155 lb 9.6 oz (70.6 kg)  10/20/22 152 lb 9.6 oz (69.2 kg)  10/05/21 155 lb (70.3 kg)     Subjective:  Annual Exam (CPE, fasting labs, pt. Did not want covid vaccine, asked if she had to start over the HPV series?)  ***  Pertinent items are noted in HPI.     01/01/2024    1:31 PM 10/20/2022    2:28 PM 10/05/2021    3:24 PM 03/22/2021    7:28 PM  Depression screen PHQ 2/9  Decreased Interest 0 0 0 0  Down, Depressed, Hopeless 0 0 0 0  PHQ - 2 Score 0 0 0 0  Altered sleeping   1   Tired, decreased energy   1   Change in appetite   0   Feeling bad or failure about yourself    0   Trouble concentrating   0   Moving slowly or fidgety/restless   0   Suicidal thoughts   0   PHQ-9 Score   2    Difficult doing work/chores   Not difficult at all      Data saved with a previous flowsheet row definition       10/05/2021    3:24 PM  GAD 7 : Generalized Anxiety Score  Nervous, Anxious, on Edge 0  Control/stop worrying 0  Worry too much - different things 0  Trouble relaxing 0  Restless 0  Easily annoyed or irritable 0  Afraid - awful might happen 0  Total GAD 7 Score 0  Anxiety Difficulty Not difficult at all       01/01/2024    1:31 PM 10/20/2022    2:28 PM 10/05/2021    3:24 PM 03/22/2021    7:28 PM  Fall Risk   Falls in the past year? 0 0 0 0  Number falls in past yr: 0 0 0 0  Injury with Fall? 0 0  0  0   Risk for fall due to : No Fall Risks No Fall Risks No Fall Risks No Fall Risks  Follow up Falls evaluation completed Falls evaluation completed Falls  evaluation completed;Education provided  Falls evaluation completed      Data saved with a previous flowsheet row definition   Past medical history, surgical history, medications, allergies, family history and social history reviewed with patient today and changes made to appropriate areas of the chart.      Objective:    Physical Exam Vitals and nursing note reviewed.  Constitutional:      General: She is not in acute distress.    Appearance: Normal appearance.  HENT:     Head: Normocephalic and atraumatic.     Right Ear: Hearing, tympanic membrane, ear canal and external ear normal.     Left Ear: Hearing, tympanic membrane, ear canal and external ear normal.     Nose: Nose normal.     Right Sinus: No maxillary sinus tenderness or frontal sinus tenderness.     Left Sinus: No maxillary sinus tenderness or frontal sinus tenderness.  Mouth/Throat:     Lips: Pink.     Mouth: Mucous membranes are moist.     Pharynx: Oropharynx is clear.  Eyes:     General: Lids are normal. Vision grossly intact.     Extraocular Movements: Extraocular movements intact.     Conjunctiva/sclera: Conjunctivae normal.     Pupils: Pupils are equal, round, and reactive to light.     Funduscopic exam:    Right eye: Red reflex present.        Left eye: Red reflex present.    Visual Fields: Right eye visual fields normal and left eye visual fields normal.  Neck:     Thyroid : No thyromegaly.     Vascular: No carotid bruit.  Cardiovascular:     Rate and Rhythm: Normal rate and regular rhythm.     Chest Wall: PMI is not displaced.     Pulses: Normal pulses.          Dorsalis pedis pulses are 2+ on the right side and 2+ on the left side.       Posterior tibial pulses are 2+ on the right side and 2+ on the left side.     Heart sounds: Normal heart sounds. No murmur heard. Pulmonary:     Effort: Pulmonary effort is normal. No respiratory distress.     Breath sounds: Normal breath sounds.  Abdominal:      General: Abdomen is flat. Bowel sounds are normal. There is no distension.     Palpations: Abdomen is soft. There is no hepatomegaly, splenomegaly or mass.     Tenderness: There is no abdominal tenderness. There is no right CVA tenderness, left CVA tenderness, guarding or rebound.  Musculoskeletal:        General: Normal range of motion.     Cervical back: Full passive range of motion without pain, normal range of motion and neck supple. No tenderness.     Right lower leg: No edema.     Left lower leg: No edema.  Feet:     Left foot:     Toenail Condition: Left toenails are normal.  Lymphadenopathy:     Cervical: No cervical adenopathy.     Upper Body:     Right upper body: No supraclavicular adenopathy.     Left upper body: No supraclavicular adenopathy.  Skin:    General: Skin is warm and dry.     Capillary Refill: Capillary refill takes less than 2 seconds.     Nails: There is no clubbing.  Neurological:     General: No focal deficit present.     Mental Status: She is alert and oriented to person, place, and time.     GCS: GCS eye subscore is 4. GCS verbal subscore is 5. GCS motor subscore is 6.     Sensory: Sensation is intact.     Motor: Motor function is intact.     Coordination: Coordination is intact.     Gait: Gait is intact.     Deep Tendon Reflexes: Reflexes are normal and symmetric.  Psychiatric:        Attention and Perception: Attention normal.        Mood and Affect: Mood normal.        Speech: Speech normal.        Behavior: Behavior normal. Behavior is cooperative.        Thought Content: Thought content normal.        Cognition and Memory: Cognition and memory normal.  Judgment: Judgment normal.         Assessment & Plan:   Assessment & Plan Encounter for annual physical exam  Orders:   lisdexamfetamine  (VYVANSE ) 40 MG capsule; Take 1 capsule (40 mg total) by mouth every morning.   lisdexamfetamine  (VYVANSE ) 40 MG capsule; Take 1 capsule (40  mg total) by mouth every morning.   lisdexamfetamine  (VYVANSE ) 40 MG capsule; Take 1 capsule (40 mg total) by mouth every morning.   sertraline  (ZOLOFT ) 50 MG tablet; Take 1 tablet (50 mg total) by mouth daily.   buPROPion  (WELLBUTRIN  XL) 150 MG 24 hr tablet; Take 1 tablet (150 mg total) by mouth daily.  Essential hypertension  Orders:   CBC with Differential/Platelet   CMP14+EGFR   Hemoglobin A1c   Lipid panel  Attention deficit hyperactivity disorder (ADHD), predominantly inattentive type  Orders:   lisdexamfetamine  (VYVANSE ) 40 MG capsule; Take 1 capsule (40 mg total) by mouth every morning.   lisdexamfetamine  (VYVANSE ) 40 MG capsule; Take 1 capsule (40 mg total) by mouth every morning.   lisdexamfetamine  (VYVANSE ) 40 MG capsule; Take 1 capsule (40 mg total) by mouth every morning.   amphetamine -dextroamphetamine  (ADDERALL ) 20 MG tablet; Take 0.5-1 tablets (10-20 mg total) by mouth daily in the afternoon.   amphetamine -dextroamphetamine  (ADDERALL ) 20 MG tablet; Take 0.5-1 tablets (10-20 mg total) by mouth daily in the afternoon.   amphetamine -dextroamphetamine  (ADDERALL ) 20 MG tablet; Take 0.5-1 tablets (10-20 mg total) by mouth daily in the afternoon.  Nodular acne  Orders:   Testosterone, Total, LC/MS/MS  Screening for endocrine, nutritional, metabolic and immunity disorder     Anxiety with depression  Orders:   sertraline  (ZOLOFT ) 50 MG tablet; Take 1 tablet (50 mg total) by mouth daily.   buPROPion  (WELLBUTRIN  XL) 150 MG 24 hr tablet; Take 1 tablet (150 mg total) by mouth daily.  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    PATIENT COUNSELING PROVIDED FOR ALL ADULT PATIENTS: A well balanced diet low in saturated fats, cholesterol, and moderation in carbohydrates.  This can be as simple as monitoring portion sizes and cutting  back on sugary beverages such as soda and juice to start with.    Daily water consumption of at least 64 ounces.  Physical activity at least 180 minutes per week.  If just starting out, start 10 minutes a day and work your way up.   This can be as simple as taking the stairs instead of the elevator and walking 2-3 laps around the office  purposefully every day.   STD protection, partner selection, and regular testing if high risk.  Limited consumption of alcoholic beverages if alcohol is consumed. For men, I recommend no more than 14 alcoholic beverages per week, spread out throughout the week (max 2 per day). Avoid binge drinking or consuming large quantities of alcohol in one setting.  Please let me know if you feel you may need help with reduction or quitting alcohol consumption.   Avoidance of nicotine, if used. Please let me know if you feel you may need help with reduction or quitting nicotine use.   Daily mental health attention. This can be in the form of 5 minute daily meditation, prayer, journaling, yoga, reflection, etc.  Purposeful attention to your emotions and mental state can significantly improve your overall wellbeing  and  Health.  Please know that I am here to  help you with all of your health care goals and am happy to work with you to find a solution that works best for you.  The greatest advice I have received with any changes in life are to take it one step at a time, that even means if all you can focus on is the next 60 seconds, then do that and celebrate your victories.  With any changes in life, you will have set backs, and that is OK. The important thing to remember is, if you have a set back, it is not a failure, it is an opportunity to try again! Screening Testing Mammogram Every 1 -2 years based on history and risk factors Starting at age 80 Pap Smear Ages 21-39 every 3 years Ages 52-65 every 5 years with HPV testing More frequent testing may be  required based on results and history Colon Cancer Screening Every 1-10 years based on test performed, risk factors, and history Starting at age 59 Bone Density Screening Every 2-10 years based on history Starting at age 30 for women Recommendations for men differ based on medication usage, history, and risk factors AAA Screening One time ultrasound Men 40-76 years old who have every smoked Lung Cancer Screening Low Dose Lung CT every 12 months Age 58-80 years with a 30 pack-year smoking history who still smoke or who have quit within the last 15 years   Screening Labs Routine  Labs: Complete Blood Count (CBC), Complete Metabolic Panel (CMP), Cholesterol (Lipid Panel) Every 6-12 months based on history and medications May be recommended more frequently based on current conditions or previous results Hemoglobin A1c Lab Every 3-12 months based on history and previous results Starting at age 33 or earlier with diagnosis of diabetes, high cholesterol, BMI >26, and/or risk factors Frequent monitoring for patients with diabetes to ensure blood sugar control Thyroid  Panel (TSH) Every 6 months based on history, symptoms, and risk factors May be repeated more often if on medication HIV One time testing for all patients 23 and older May be repeated more frequently for patients with increased risk factors or exposure Hepatitis C One time testing for all patients 27 and older May be repeated more frequently for patients with increased risk factors or exposure Gonorrhea, Chlamydia Every 12 months for all sexually active persons 13-24 years Additional monitoring may be recommended for those who are considered high risk or who have symptoms Every 12 months for any woman on birth control, regardless of sexual activity PSA Men 22-27 years old with risk factors Additional screening may be recommended from age 25-69 based on risk factors, symptoms, and history  Vaccine Recommendations Tetanus  Booster All adults every 10 years Flu Vaccine All patients 6 months and older every year COVID Vaccine All patients 12 years and older Initial dosing with booster May recommend additional booster based on age and health history HPV Vaccine 2 doses all patients age 30-26 Dosing may be considered for patients over 26 Shingles Vaccine (Shingrix) 2 doses all adults 55 years and older Pneumonia (Pneumovax 17) All adults 65 years and older May recommend earlier dosing based on health history One year apart from Prevnar 23 Pneumonia (Prevnar 61) All adults 65 years and older Dosed 1 year after Pneumovax 23 Pneumonia (Prevnar 20) One time alternative to the two dosing of 13 and 23 For all adults with initial dose of 23, 20 is recommended 1 year later For all adults with initial dose of 13, 23 is still recommended as second option  1 year later

## 2024-01-01 NOTE — Assessment & Plan Note (Addendum)
 CPE completed today. Review of HM activities and recommendations discussed and provided on AVS. Anticipatory guidance, diet, and exercise recommendations provided. Medications, allergies, and hx reviewed and updated as necessary. Orders placed as listed below.  Plan: - Labs ordered. Will make changes as necessary based on results.  - I will review these results and send recommendations via MyChart or a telephone call.  - F/U with CPE in 1 year or sooner for acute/chronic health needs as directed.   Orders:   lisdexamfetamine  (VYVANSE ) 40 MG capsule; Take 1 capsule (40 mg total) by mouth every morning.   lisdexamfetamine  (VYVANSE ) 40 MG capsule; Take 1 capsule (40 mg total) by mouth every morning.   lisdexamfetamine  (VYVANSE ) 40 MG capsule; Take 1 capsule (40 mg total) by mouth every morning.   sertraline  (ZOLOFT ) 50 MG tablet; Take 1 tablet (50 mg total) by mouth daily.   buPROPion  (WELLBUTRIN  XL) 150 MG 24 hr tablet; Take 1 tablet (150 mg total) by mouth daily.

## 2024-01-01 NOTE — Assessment & Plan Note (Signed)
 Currently managed with sertraline  and Wellbutrin . Attempted to taper off sertraline  but experienced irritability, indicating a need to continue current regimen. - Continue sertraline  and Wellbutrin  as prescribed. Orders:   sertraline  (ZOLOFT ) 50 MG tablet; Take 1 tablet (50 mg total) by mouth daily.   buPROPion  (WELLBUTRIN  XL) 150 MG 24 hr tablet; Take 1 tablet (150 mg total) by mouth daily.

## 2024-01-01 NOTE — Assessment & Plan Note (Signed)
 [name]

## 2024-01-01 NOTE — Assessment & Plan Note (Signed)
 Vyvanse  is effective but may cause insomnia if taken later in the day. She experiences a drop in focus in the afternoon and is interested in a short-acting stimulant for additional support. - Prescribed short-acting Adderall  for afternoon use as needed. Orders:   lisdexamfetamine  (VYVANSE ) 40 MG capsule; Take 1 capsule (40 mg total) by mouth every morning.   lisdexamfetamine  (VYVANSE ) 40 MG capsule; Take 1 capsule (40 mg total) by mouth every morning.   lisdexamfetamine  (VYVANSE ) 40 MG capsule; Take 1 capsule (40 mg total) by mouth every morning.   amphetamine -dextroamphetamine  (ADDERALL ) 20 MG tablet; Take 0.5-1 tablets (10-20 mg total) by mouth daily in the afternoon.   amphetamine -dextroamphetamine  (ADDERALL ) 20 MG tablet; Take 0.5-1 tablets (10-20 mg total) by mouth daily in the afternoon.   amphetamine -dextroamphetamine  (ADDERALL ) 20 MG tablet; Take 0.5-1 tablets (10-20 mg total) by mouth daily in the afternoon.

## 2024-01-02 ENCOUNTER — Other Ambulatory Visit: Payer: Self-pay

## 2024-01-02 ENCOUNTER — Encounter: Payer: Self-pay | Admitting: Pharmacist

## 2024-01-02 ENCOUNTER — Other Ambulatory Visit (HOSPITAL_COMMUNITY): Payer: Self-pay

## 2024-01-02 LAB — VITAMIN D 25 HYDROXY (VIT D DEFICIENCY, FRACTURES): Vit D, 25-Hydroxy: 42 ng/mL (ref 30.0–100.0)

## 2024-01-03 ENCOUNTER — Other Ambulatory Visit: Payer: Self-pay

## 2024-01-03 ENCOUNTER — Other Ambulatory Visit (HOSPITAL_COMMUNITY): Payer: Self-pay

## 2024-01-03 ENCOUNTER — Other Ambulatory Visit (HOSPITAL_BASED_OUTPATIENT_CLINIC_OR_DEPARTMENT_OTHER): Payer: Self-pay | Admitting: Nurse Practitioner

## 2024-01-03 ENCOUNTER — Ambulatory Visit: Payer: Self-pay | Admitting: Nurse Practitioner

## 2024-01-03 DIAGNOSIS — L700 Acne vulgaris: Secondary | ICD-10-CM

## 2024-01-03 DIAGNOSIS — G43909 Migraine, unspecified, not intractable, without status migrainosus: Secondary | ICD-10-CM

## 2024-01-03 LAB — CBC WITH DIFFERENTIAL/PLATELET
Basophils Absolute: 0.1 x10E3/uL (ref 0.0–0.2)
Basos: 1 %
EOS (ABSOLUTE): 0.3 x10E3/uL (ref 0.0–0.4)
Eos: 4 %
Hematocrit: 40.8 % (ref 34.0–46.6)
Hemoglobin: 13.7 g/dL (ref 11.1–15.9)
Immature Grans (Abs): 0 x10E3/uL (ref 0.0–0.1)
Immature Granulocytes: 0 %
Lymphocytes Absolute: 1.3 x10E3/uL (ref 0.7–3.1)
Lymphs: 19 %
MCH: 31.7 pg (ref 26.6–33.0)
MCHC: 33.6 g/dL (ref 31.5–35.7)
MCV: 94 fL (ref 79–97)
Monocytes Absolute: 0.3 x10E3/uL (ref 0.1–0.9)
Monocytes: 5 %
Neutrophils Absolute: 4.9 x10E3/uL (ref 1.4–7.0)
Neutrophils: 71 %
Platelets: 293 x10E3/uL (ref 150–450)
RBC: 4.32 x10E6/uL (ref 3.77–5.28)
RDW: 12.5 % (ref 11.7–15.4)
WBC: 6.9 x10E3/uL (ref 3.4–10.8)

## 2024-01-03 LAB — CMP14+EGFR
ALT: 14 IU/L (ref 0–32)
AST: 14 IU/L (ref 0–40)
Albumin: 4 g/dL (ref 3.9–4.9)
Alkaline Phosphatase: 59 IU/L (ref 41–116)
BUN/Creatinine Ratio: 14 (ref 9–23)
BUN: 10 mg/dL (ref 6–24)
Bilirubin Total: 0.4 mg/dL (ref 0.0–1.2)
CO2: 20 mmol/L (ref 20–29)
Calcium: 8.9 mg/dL (ref 8.7–10.2)
Chloride: 108 mmol/L — ABNORMAL HIGH (ref 96–106)
Creatinine, Ser: 0.7 mg/dL (ref 0.57–1.00)
Globulin, Total: 2.5 g/dL (ref 1.5–4.5)
Glucose: 89 mg/dL (ref 70–99)
Potassium: 4.2 mmol/L (ref 3.5–5.2)
Sodium: 142 mmol/L (ref 134–144)
Total Protein: 6.5 g/dL (ref 6.0–8.5)
eGFR: 111 mL/min/1.73

## 2024-01-03 LAB — HEMOGLOBIN A1C
Est. average glucose Bld gHb Est-mCnc: 85 mg/dL
Hgb A1c MFr Bld: 4.6 % — ABNORMAL LOW (ref 4.8–5.6)

## 2024-01-03 LAB — LIPID PANEL
Chol/HDL Ratio: 2.2 ratio (ref 0.0–4.4)
Cholesterol, Total: 169 mg/dL (ref 100–199)
HDL: 77 mg/dL
LDL Chol Calc (NIH): 78 mg/dL (ref 0–99)
Triglycerides: 75 mg/dL (ref 0–149)
VLDL Cholesterol Cal: 14 mg/dL (ref 5–40)

## 2024-01-03 LAB — TESTOSTERONE, TOTAL, LC/MS/MS: Testosterone, total: 23.4 ng/dL

## 2024-01-03 MED ORDER — SUMATRIPTAN SUCCINATE 25 MG PO TABS
25.0000 mg | ORAL_TABLET | Freq: Every day | ORAL | 5 refills | Status: AC | PRN
  Filled 2024-01-03: qty 18, 30d supply, fill #0
  Filled 2024-02-07: qty 27, 90d supply, fill #0

## 2024-01-03 MED ORDER — SPIRONOLACTONE 50 MG PO TABS
ORAL_TABLET | ORAL | 1 refills | Status: AC
  Filled 2024-01-03: qty 180, 90d supply, fill #0

## 2024-01-05 ENCOUNTER — Other Ambulatory Visit (HOSPITAL_COMMUNITY): Payer: Self-pay

## 2024-01-05 ENCOUNTER — Encounter: Payer: Self-pay | Admitting: Pharmacist

## 2024-01-05 ENCOUNTER — Other Ambulatory Visit: Payer: Self-pay

## 2024-01-06 ENCOUNTER — Other Ambulatory Visit (HOSPITAL_COMMUNITY): Payer: Self-pay

## 2024-01-08 ENCOUNTER — Encounter: Payer: Self-pay | Admitting: Pharmacist

## 2024-01-08 ENCOUNTER — Other Ambulatory Visit: Payer: Self-pay

## 2024-01-10 ENCOUNTER — Other Ambulatory Visit: Payer: Self-pay

## 2024-01-11 ENCOUNTER — Other Ambulatory Visit: Payer: Self-pay

## 2024-01-19 ENCOUNTER — Other Ambulatory Visit (HOSPITAL_COMMUNITY): Payer: Self-pay

## 2024-02-07 ENCOUNTER — Other Ambulatory Visit (HOSPITAL_COMMUNITY): Payer: Self-pay

## 2024-02-07 ENCOUNTER — Other Ambulatory Visit: Payer: Self-pay

## 2024-02-08 ENCOUNTER — Other Ambulatory Visit: Payer: Self-pay

## 2025-01-06 ENCOUNTER — Encounter: Admitting: Nurse Practitioner
